# Patient Record
Sex: Female | Born: 1955 | Race: White | Hispanic: No | Smoking: Never smoker
Health system: Southern US, Community
[De-identification: ages and names within clinical notes are randomized; demographics above are authoritative.]

## PROBLEM LIST (undated history)

## (undated) DIAGNOSIS — K219 Gastro-esophageal reflux disease without esophagitis: Secondary | ICD-10-CM

## (undated) DIAGNOSIS — F419 Anxiety disorder, unspecified: Secondary | ICD-10-CM

## (undated) DIAGNOSIS — J45909 Unspecified asthma, uncomplicated: Secondary | ICD-10-CM

## (undated) DIAGNOSIS — H269 Unspecified cataract: Secondary | ICD-10-CM

## (undated) DIAGNOSIS — T7840XA Allergy, unspecified, initial encounter: Secondary | ICD-10-CM

## (undated) HISTORY — DX: Unspecified asthma, uncomplicated: J45.909

## (undated) HISTORY — PX: TUBAL LIGATION: SHX77

## (undated) HISTORY — PX: EYE SURGERY: SHX253

## (undated) HISTORY — DX: Unspecified cataract: H26.9

## (undated) HISTORY — DX: Allergy, unspecified, initial encounter: T78.40XA

## (undated) HISTORY — DX: Gastro-esophageal reflux disease without esophagitis: K21.9

## (undated) HISTORY — DX: Anxiety disorder, unspecified: F41.9

---

## 1994-03-26 HISTORY — PX: THYROID LOBECTOMY: SHX420

## 2021-07-12 NOTE — Progress Notes (Signed)
? ?BP 135/76   Pulse 86   Temp 98.4 ?F (36.9 ?C) (Oral)   Ht 5' 7.5" (1.715 m)   Wt 147 lb 6.4 oz (66.9 kg)   SpO2 98%   BMI 22.75 kg/m?   ? ?Subjective:  ? ? Patient ID: [facility], female    DOB: 10-Dec-1955, 66 y.o.   MRN: [id number] ? ?HPI: ?Krystal Elliott is a 66 y.o. female ? ?Chief Complaint  ?Patient presents with  ? Establish Care  ? ?Patient presents to clinic to establish care with new PCP.  Introduced to Publishing rights manager role and practice setting.  All questions answered.  Discussed provider/patient relationship and expectations.  Patient reports a history of Constipation, Seasonal Allergies, Anxiety and Depression.  Patient is new to the area she moved here from [place] in September 2022.  Has had one lobe of her thyroid removed due to a mass in 1996 and has never been on medication. Her seasonal allergies have caused her to be hospitalized with nebulizer.   ? ? ?Patient denies a history of: Hypertension, Elevated Cholesterol, Diabetes, Neurological problems, and Abdominal problems.  ? ? Denies HA, CP, SOB, dizziness, palpitations, visual changes, and lower extremity swelling. ? ? ?Active Ambulatory Problems  ?  Diagnosis Date Noted  ? Anxiety 07/13/2021  ? Depression, recurrent (HCC) 07/13/2021  ? Seasonal allergies 07/13/2021  ? 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 ?Resolved Ambulatory Problems  ?  Diagnosis Date Noted  ? No Resolved Ambulatory Problems  ? ?Past Medical History:  ?Diagnosis Date  ? Allergy   ? Asthma   ? ?Past Surgical History:  ?Procedure Laterality Date  ? TUBAL LIGATION    ? ?History reviewed. No pertinent family history. ? ? ?Review of Systems  ?Eyes:  Negative for visual disturbance.  ?Respiratory:  Negative for cough, chest tightness and shortness of breath.   ?Cardiovascular:  Negative for chest pain, palpitations and leg swelling.  ?Neurological:  Negative for dizziness and headaches.  ? ?Per HPI unless specifically indicated above ? ?   ?Objective:  ?   ?BP 135/76   Pulse 86   Temp 98.4 ?F (36.9 ?C) (Oral)   Ht 5' 7.5" (1.715 m)   Wt 147 lb 6.4 oz (66.9 kg)   SpO2 98%   BMI 22.75 kg/m?   ?Wt Readings from Last 3 Encounters:  ?07/13/21 147 lb 6.4 oz (66.9 kg)  ?  ?Physical Exam ?Vitals and nursing note reviewed.  ?Constitutional:   ?   General: She is not in acute distress. ?   Appearance: Normal appearance. She is normal weight. She is not ill-appearing, toxic-appearing or diaphoretic.  ?HENT:  ?   Head: Normocephalic.  ?   Right Ear: External ear normal.  ?   Left Ear: External ear normal.  ?   Nose: Nose normal.  ?   Mouth/Throat:  ?   Mouth: Mucous membranes are moist.  ?   Pharynx: Oropharynx is clear.  ?Eyes:  ?   General:     ?   Right eye: No discharge.     ?   Left eye: No discharge.  ?   Extraocular Movements: Extraocular movements intact.  ?   Conjunctiva/sclera: Conjunctivae normal.  ?   Pupils: Pupils are equal, round, and reactive to light.  ?Cardiovascular:  ?   Rate and Rhythm: Normal rate and regular rhythm.  ?   Heart sounds: No murmur heard. ?Pulmonary:  ?   Effort: Pulmonary effort is normal. No  respiratory distress.  ?   Breath sounds: Normal breath sounds. No wheezing or rales.  ?Musculoskeletal:  ?   Cervical back: Normal range of motion and neck supple.  ?Skin: ?   General: Skin is warm and dry.  ?   Capillary Refill: Capillary refill takes less than 2 seconds.  ?Neurological:  ?   General: No focal deficit present.  ?   Mental Status: She is alert and oriented to person, place, and time. Mental status is at baseline.  ?Psychiatric:     ?   Mood and Affect: Mood normal.     ?   Behavior: Behavior normal.     ?   Thought Content: Thought content normal.     ?   Judgment: Judgment normal.  ? ? ?No results found for this or any previous visit. ?   ?Assessment & Plan:  ? ?Problem List Items Addressed This Visit   ? ?  ? Other  ? Anxiety  ?  Chronic.  Controlled.  Continue with current medication regimen of Celexa 10mg  daily.  Refill sent  today.  Return to clinic in 1 months for reevaluation.  Call sooner if concerns arise.  ?  ?  ? Relevant Medications  ? citalopram (CELEXA) 10 MG tablet  ? Depression, recurrent (HCC)  ?  Chronic.  Controlled.  Continue with current medication regimen of Celexa 10mg  daily.  Refill sent today.  Return to clinic in 1 months for reevaluation.  Call sooner if concerns arise.  ? ? ?  ?  ? Relevant Medications  ? citalopram (CELEXA) 10 MG tablet  ? Seasonal allergies  ?  Chronic. Usually controlled with OTC and Singulair PRN.  Has been out of Singulair.  Will send refill at visit today.  Follow up in 1 month for reevaluation. ? ?  ?  ? History of lobectomy of thyroid  ?  Lobectomy done in 1996.  Has never been on medication. Mass was encapsulated and no further treatment required.  Will check labs at future visits and treat as needed.  Follow up in 1 month for reevaluation. ? ?  ?  ? ?Other Visit Diagnoses   ? ? Encounter to establish care    -  Primary  ? History of basal cell cancer      ? Relevant Orders  ? Ambulatory referral to Dermatology  ? ?  ?  ? ?Follow up plan: ?Return in about 1 month (around 08/12/2021) for Physical and Fasting labs. ? ? ? ? ? ?

## 2021-07-13 ENCOUNTER — Encounter: Payer: Self-pay | Admitting: Nurse Practitioner

## 2021-07-13 ENCOUNTER — Ambulatory Visit (INDEPENDENT_AMBULATORY_CARE_PROVIDER_SITE_OTHER): Payer: 59 | Admitting: Nurse Practitioner

## 2021-07-13 VITALS — BP 135/76 | HR 86 | Temp 98.4°F | Ht 67.5 in | Wt 147.4 lb

## 2021-07-13 DIAGNOSIS — Z85828 Personal history of other malignant neoplasm of skin: Secondary | ICD-10-CM

## 2021-07-13 DIAGNOSIS — J302 Other seasonal allergic rhinitis: Secondary | ICD-10-CM | POA: Diagnosis not present

## 2021-07-13 DIAGNOSIS — Z9009 Acquired absence of other part of head and neck: Secondary | ICD-10-CM

## 2021-07-13 DIAGNOSIS — Z7689 Persons encountering health services in other specified circumstances: Secondary | ICD-10-CM

## 2021-07-13 DIAGNOSIS — F339 Major depressive disorder, recurrent, unspecified: Secondary | ICD-10-CM | POA: Insufficient documentation

## 2021-07-13 DIAGNOSIS — R69 Illness, unspecified: Secondary | ICD-10-CM | POA: Diagnosis not present

## 2021-07-13 DIAGNOSIS — F419 Anxiety disorder, unspecified: Secondary | ICD-10-CM | POA: Diagnosis not present

## 2021-07-13 MED ORDER — ALBUTEROL SULFATE HFA 108 (90 BASE) MCG/ACT IN AERS: 1.0000 | INHALATION_SPRAY | Freq: Four times a day (QID) | RESPIRATORY_TRACT | 1 refills | Status: DC | PRN

## 2021-07-13 MED ORDER — MONTELUKAST SODIUM 10 MG PO TABS: 10.0000 mg | ORAL_TABLET | Freq: Every day | ORAL | 1 refills | Status: DC

## 2021-07-13 MED ORDER — CITALOPRAM HYDROBROMIDE 10 MG PO TABS: 10.0000 mg | ORAL_TABLET | Freq: Every day | ORAL | 1 refills | Status: DC

## 2021-07-13 NOTE — Assessment & Plan Note (Signed)
Chronic.  Controlled.  Continue with current medication regimen of Celexa 10mg daily.  Refill sent today.  Return to clinic in 1 months for reevaluation.  Call sooner if concerns arise.  ?

## 2021-07-13 NOTE — Assessment & Plan Note (Signed)
Lobectomy done in 1996.  Has never been on medication. Mass was encapsulated and no further treatment required.  Will check labs at future visits and treat as needed.  Follow up in 1 month for reevaluation. ?

## 2021-07-13 NOTE — Assessment & Plan Note (Signed)
Chronic. Usually controlled with OTC and Singulair PRN.  Has been out of Singulair.  Will send refill at visit today.  Follow up in 1 month for reevaluation. ?

## 2021-07-13 NOTE — Assessment & Plan Note (Signed)
Chronic.  Controlled.  Continue with current medication regimen of Celexa 10mg  daily.  Refill sent today.  Return to clinic in 1 months for reevaluation.  Call sooner if concerns arise.  ?

## 2021-08-04 ENCOUNTER — Other Ambulatory Visit: Payer: Self-pay | Admitting: Nurse Practitioner

## 2021-08-07 NOTE — Telephone Encounter (Signed)
Requested Prescriptions  ?Pending Prescriptions Disp Refills  ?? citalopram (CELEXA) 10 MG tablet [Pharmacy Med Name: CITALOPRAM HBR 10 MG TABLET] 90 tablet 2  ?  Sig: TAKE 1 TABLET BY MOUTH EVERY DAY  ?  ? Psychiatry:  Antidepressants - SSRI Passed - 08/04/2021  1:31 PM  ?  ?  Passed - Completed PHQ-2 or PHQ-9 in the last 360 days  ?  ?  Passed - Valid encounter within last 6 months  ?  Recent Outpatient Visits   ?      ? 3 weeks ago Encounter to establish care  ? [facility] Larae Grooms, NP  ?  ?  ?Future Appointments   ?        ? In 1 week Larae Grooms, NP [facility]  ?  ? ?  ?  ?  ? ? ?

## 2021-08-16 ENCOUNTER — Ambulatory Visit (INDEPENDENT_AMBULATORY_CARE_PROVIDER_SITE_OTHER): Payer: 59 | Admitting: Nurse Practitioner

## 2021-08-16 ENCOUNTER — Other Ambulatory Visit (HOSPITAL_COMMUNITY)
Admission: RE | Admit: 2021-08-16 | Discharge: 2021-08-16 | Disposition: A | Discharge status: Home / Self Care | Payer: 59 | Source: Ambulatory Visit | Attending: Nurse Practitioner | Admitting: Nurse Practitioner

## 2021-08-16 ENCOUNTER — Encounter: Payer: Self-pay | Admitting: Nurse Practitioner

## 2021-08-16 VITALS — BP 122/66 | HR 71 | Temp 98.2°F | Ht 66.14 in | Wt 145.8 lb

## 2021-08-16 DIAGNOSIS — F419 Anxiety disorder, unspecified: Secondary | ICD-10-CM

## 2021-08-16 DIAGNOSIS — Z0000 Encounter for general adult medical examination without abnormal findings: Secondary | ICD-10-CM

## 2021-08-16 DIAGNOSIS — Z23 Encounter for immunization: Secondary | ICD-10-CM

## 2021-08-16 DIAGNOSIS — Z136 Encounter for screening for cardiovascular disorders: Secondary | ICD-10-CM | POA: Diagnosis not present

## 2021-08-16 DIAGNOSIS — R69 Illness, unspecified: Secondary | ICD-10-CM | POA: Diagnosis not present

## 2021-08-16 DIAGNOSIS — Z9009 Acquired absence of other part of head and neck: Secondary | ICD-10-CM

## 2021-08-16 DIAGNOSIS — Z1231 Encounter for screening mammogram for malignant neoplasm of breast: Secondary | ICD-10-CM | POA: Diagnosis not present

## 2021-08-16 DIAGNOSIS — F339 Major depressive disorder, recurrent, unspecified: Secondary | ICD-10-CM | POA: Diagnosis not present

## 2021-08-16 MED ORDER — CITALOPRAM HYDROBROMIDE 10 MG PO TABS: 10.0000 mg | ORAL_TABLET | Freq: Every day | ORAL | 1 refills | Status: DC

## 2021-08-16 NOTE — Assessment & Plan Note (Signed)
Ongoing. Patient has been getting thyroid scans annually.  Has not had one in 2-3 years. Ordered today for evaluation.

## 2021-08-16 NOTE — Patient Instructions (Signed)
Please call to schedule your mammogram and/or bone density: ?[facility]  ?Address: [address], [place], [place] [address] ?Phone: [phone]  ?

## 2021-08-16 NOTE — Progress Notes (Signed)
BP 122/66   Pulse 71   Temp 98.2 F (36.8 C) (Oral)   Ht 5' 6.14" (1.68 m)   Wt 145 lb 12.8 oz (66.1 kg)   SpO2 97%   BMI 23.43 kg/m    Subjective:    [facility]    DOB: [birth year], 66 y.o.   MRN: [id number]  HPI: Krystal RoyalsJanet Elliott is a 66 y.o. female presenting on 08/16/2021 for comprehensive medical examination. Current medical complaints include:none  She currently lives with: Menopausal Symptoms: no  MOOD Patient states her celexa works great for her.  Keeps her very level.  Denies concerns at visit today. Denies SI.  Depression Screen done today and results listed below:     08/16/2021    1:34 PM 07/13/2021    8:50 AM 07/13/2021    8:49 AM  Depression screen PHQ 2/9  Decreased Interest 0 0 0  Down, Depressed, Hopeless 0 0 0  PHQ - 2 Score 0 0 0  Altered sleeping 0 0 0  Tired, decreased energy 0 1 0  Change in appetite 0 0 0  Feeling bad or failure about yourself  0 0 1  Trouble concentrating 0 0 0  Moving slowly or fidgety/restless 0 0 0  Suicidal thoughts 0 0 0  PHQ-9 Score 0 1 1  Difficult doing work/chores Not difficult at all Somewhat difficult Not difficult at all    The patient does not have a history of falls. I did complete a risk assessment for falls. A plan of care for falls was documented.   Past Medical History:  Past Medical History:  Diagnosis Date   Allergy    Asthma     Surgical History:  Past Surgical History:  Procedure Laterality Date   TUBAL LIGATION      Medications:  Current Outpatient Medications on File Prior to Visit  Medication Sig   albuterol (VENTOLIN HFA) 108 (90 Base) MCG/ACT inhaler Inhale 1-2 puffs into the lungs every 6 (six) hours as needed for wheezing or shortness of breath.   Cholecalciferol (VITAMIN D3) 10 MCG (400 UNIT) tablet Take by mouth.   montelukast (SINGULAIR) 10 MG tablet Take 1 tablet (10 mg total) by mouth at bedtime.   No current facility-administered medications on file prior to  visit.    Allergies:  Allergies  Allergen Reactions   Penicillins Rash   Sulfa Antibiotics Rash    Social History:  Social History   Socioeconomic History   Marital status: Unknown    Spouse name: Not on file   Number of children: Not on file   Years of education: Not on file   Highest education level: Not on file  Occupational History   Not on file  Tobacco Use   Smoking status: Never   Smokeless tobacco: Never  Vaping Use   Vaping Use: Never used  Substance and Sexual Activity   Alcohol use: Yes   Drug use: Not Currently   Sexual activity: Yes  Other Topics Concern   Not on file  Social History Narrative   Not on file   Social Determinants of Health   Financial Resource Strain: Not on file  Food Insecurity: Not on file  Transportation Needs: Not on file  Physical Activity: Not on file  Stress: Not on file  Social Connections: Not on file  Intimate Partner Violence: Not on file   Social History   Tobacco Use  Smoking Status Never  Smokeless Tobacco Never   Social  History   Substance and Sexual Activity  Alcohol Use Yes    Family History:  History reviewed. No pertinent family history.  Past medical history, surgical history, medications, allergies, family history and social history reviewed with patient today and changes made to appropriate areas of the chart.   Review of Systems  Psychiatric/Behavioral:  Negative for depression and suicidal ideas. The patient is not nervous/anxious.   All other ROS negative except what is listed above and in the HPI.      Objective:    BP 122/66   Pulse 71   Temp 98.2 F (36.8 C) (Oral)   Ht 5' 6.14" (1.68 m)   Wt 145 lb 12.8 oz (66.1 kg)   SpO2 97%   BMI 23.43 kg/m   Wt Readings from Last 3 Encounters:  08/16/21 145 lb 12.8 oz (66.1 kg)  07/13/21 147 lb 6.4 oz (66.9 kg)    Physical Exam Vitals and nursing note reviewed. Exam conducted with a chaperone present [facility] [place] [place]).  Constitutional:       General: She is awake. She is not in acute distress.    Appearance: Normal appearance. She is well-developed and normal weight. She is not ill-appearing.  HENT:     Head: Normocephalic and atraumatic.     Right Ear: Hearing, tympanic membrane, ear canal and external ear normal. No drainage.     Left Ear: Hearing, tympanic membrane, ear canal and external ear normal. No drainage.     Nose: Nose normal.     Right Sinus: No maxillary sinus tenderness or frontal sinus tenderness.     Left Sinus: No maxillary sinus tenderness or frontal sinus tenderness.     Mouth/Throat:     Mouth: Mucous membranes are moist.     Pharynx: Oropharynx is clear. Uvula midline. No pharyngeal swelling, oropharyngeal exudate or posterior oropharyngeal erythema.  Eyes:     General: Lids are normal.        Right eye: No discharge.        Left eye: No discharge.     Extraocular Movements: Extraocular movements intact.     Conjunctiva/sclera: Conjunctivae normal.     Pupils: Pupils are equal, round, and reactive to light.     Visual Fields: Right eye visual fields normal and left eye visual fields normal.  Neck:     Thyroid: No thyromegaly.     Vascular: No carotid bruit.     Trachea: Trachea normal.  Cardiovascular:     Rate and Rhythm: Normal rate and regular rhythm.     Heart sounds: Normal heart sounds. No murmur heard.   No gallop.  Pulmonary:     Effort: Pulmonary effort is normal. No accessory muscle usage or respiratory distress.     Breath sounds: Normal breath sounds.  Chest:  Breasts:    Right: Normal.     Left: Normal.  Abdominal:     General: Bowel sounds are normal.     Palpations: Abdomen is soft. There is no hepatomegaly or splenomegaly.     Tenderness: There is no abdominal tenderness.  Genitourinary:    Exam position: Knee-chest position.     Vagina: Normal.     Cervix: Normal.     Adnexa: Right adnexa normal and left adnexa normal.  Musculoskeletal:        General: Normal range  of motion.     Cervical back: Normal range of motion and neck supple.     Right lower leg: No edema.  Left lower leg: No edema.  Lymphadenopathy:     Head:     Right side of head: No submental, submandibular, tonsillar, preauricular or posterior auricular adenopathy.     Left side of head: No submental, submandibular, tonsillar, preauricular or posterior auricular adenopathy.     Cervical: No cervical adenopathy.     Upper Body:     Right upper body: No supraclavicular, axillary or pectoral adenopathy.     Left upper body: No supraclavicular, axillary or pectoral adenopathy.  Skin:    General: Skin is warm and dry.     Capillary Refill: Capillary refill takes less than 2 seconds.     Findings: No rash.  Neurological:     Mental Status: She is alert and oriented to person, place, and time.     Gait: Gait is intact.     Deep Tendon Reflexes: Reflexes are normal and symmetric.     Reflex Scores:      Brachioradialis reflexes are 2+ on the right side and 2+ on the left side.      Patellar reflexes are 2+ on the right side and 2+ on the left side. Psychiatric:        Attention and Perception: Attention normal.        Mood and Affect: Mood normal.        Speech: Speech normal.        Behavior: Behavior normal. Behavior is cooperative.        Thought Content: Thought content normal.        Judgment: Judgment normal.    No results found for this or any previous visit.    Assessment & Plan:   Problem List Items Addressed This Visit       Other   Anxiety    Chronic.  Controlled.  Continue with current medication regimen of Celexa 10mg  daily.  Refill sent today.  Labs ordered today.  Return to clinic in 6 months for reevaluation.  Call sooner if concerns arise.        Relevant Medications   citalopram (CELEXA) 10 MG tablet   Depression, recurrent (HCC)    Chronic.  Controlled.  Continue with current medication regimen of Celexa 10mg  daily.  Refill sent today.  Labs ordered  today.  Return to clinic in 6 months for reevaluation.  Call sooner if concerns arise.         Relevant Medications   citalopram (CELEXA) 10 MG tablet   History of lobectomy of thyroid    Ongoing. Patient has been getting thyroid scans annually.  Has not had one in 2-3 years. Ordered today for evaluation.       Relevant Orders   THYROID   Other Visit Diagnoses     Annual physical exam    -  Primary   Health maintenance reviewed during visit today. Labs ordered. TDAP given. Mammogram ordered. Colonoscopy up to date.    Relevant Orders   CBC with Differential/Platelet   Comprehensive metabolic panel   Lipid panel   TSH   Urinalysis, Routine w reflex microscopic   Cytology - PAP   HIV Antibody (routine testing w rflx)   Hepatitis C Antibody   Screening for ischemic heart disease       Relevant Orders   Lipid panel   Encounter for screening mammogram for malignant neoplasm of breast       Relevant Orders   MM 3D SCREEN BREAST BILATERAL        Follow  up plan: Return in about 6 months (around 02/16/2022) for Depression/Anxiety FU.   LABORATORY TESTING:  - Pap smear: pap done  IMMUNIZATIONS:   - Tdap: Tetanus vaccination status reviewed: last tetanus booster within 10 years. - Influenza: Postponed to flu season - Pneumovax: Not applicable - Prevnar: Not applicable - COVID: Up to date - HPV: Not applicable - Shingrix vaccine:  Discussed at visit today  SCREENING: -Mammogram: Ordered today  - Colonoscopy: Up to date  - Bone Density: Not applicable  -Hearing Test: Not applicable  -Spirometry: Not applicable   PATIENT COUNSELING:   Advised to take 1 mg of folate supplement per day if capable of pregnancy.   Sexuality: Discussed sexually transmitted diseases, partner selection, use of condoms, avoidance of unintended pregnancy  and contraceptive alternatives.   Advised to avoid cigarette smoking.  I discussed with the patient that most people either abstain  from alcohol or drink within safe limits (<=14/week and <=4 drinks/occasion for males, <=7/weeks and <= 3 drinks/occasion for females) and that the risk for alcohol disorders and other health effects rises proportionally with the number of drinks per week and how often a drinker exceeds daily limits.  Discussed cessation/primary prevention of drug use and availability of treatment for abuse.   Diet: Encouraged to adjust caloric intake to maintain  or achieve ideal body weight, to reduce intake of dietary saturated fat and total fat, to limit sodium intake by avoiding high sodium foods and not adding table salt, and to maintain adequate dietary potassium and calcium preferably from fresh fruits, vegetables, and low-fat dairy products.    stressed the importance of regular exercise  Injury prevention: Discussed safety belts, safety helmets, smoke detector, smoking near bedding or upholstery.   Dental health: Discussed importance of regular tooth brushing, flossing, and dental visits.    NEXT PREVENTATIVE PHYSICAL DUE IN 1 YEAR. Return in about 6 months (around 02/16/2022) for Depression/Anxiety FU.

## 2021-08-16 NOTE — Assessment & Plan Note (Signed)
Chronic.  Controlled.  Continue with current medication regimen of Celexa 10mg daily.  Refill sent today.  Labs ordered today.  Return to clinic in 6 months for reevaluation.  Call sooner if concerns arise.  

## 2021-08-16 NOTE — Assessment & Plan Note (Signed)
Chronic.  Controlled.  Continue with current medication regimen of Celexa 10mg  daily.  Refill sent today.  Labs ordered today.  Return to clinic in 6 months for reevaluation.  Call sooner if concerns arise.

## 2021-08-17 DIAGNOSIS — D485 Neoplasm of uncertain behavior of skin: Secondary | ICD-10-CM | POA: Diagnosis not present

## 2021-08-17 DIAGNOSIS — D2271 Melanocytic nevi of right lower limb, including hip: Secondary | ICD-10-CM | POA: Diagnosis not present

## 2021-08-17 DIAGNOSIS — D2261 Melanocytic nevi of right upper limb, including shoulder: Secondary | ICD-10-CM | POA: Diagnosis not present

## 2021-08-17 DIAGNOSIS — L570 Actinic keratosis: Secondary | ICD-10-CM | POA: Diagnosis not present

## 2021-08-17 DIAGNOSIS — D2262 Melanocytic nevi of left upper limb, including shoulder: Secondary | ICD-10-CM | POA: Diagnosis not present

## 2021-08-17 DIAGNOSIS — X32XXXA Exposure to sunlight, initial encounter: Secondary | ICD-10-CM | POA: Diagnosis not present

## 2021-08-17 DIAGNOSIS — L814 Other melanin hyperpigmentation: Secondary | ICD-10-CM | POA: Diagnosis not present

## 2021-08-17 DIAGNOSIS — D225 Melanocytic nevi of trunk: Secondary | ICD-10-CM | POA: Diagnosis not present

## 2021-08-17 DIAGNOSIS — D2272 Melanocytic nevi of left lower limb, including hip: Secondary | ICD-10-CM | POA: Diagnosis not present

## 2021-08-17 LAB — COMPREHENSIVE METABOLIC PANEL
ALT: 15 IU/L (ref 0–32)
AST: 16 IU/L (ref 0–40)
Albumin/Globulin Ratio: 2.2 (ref 1.2–2.2)
Albumin: 4.8 g/dL (ref 3.8–4.8)
Alkaline Phosphatase: 62 IU/L (ref 44–121)
BUN/Creatinine Ratio: 13 (ref 12–28)
BUN: 11 mg/dL (ref 8–27)
Bilirubin Total: 0.7 mg/dL (ref 0.0–1.2)
CO2: 24 mmol/L (ref 20–29)
Calcium: 9.4 mg/dL (ref 8.7–10.3)
Chloride: 96 mmol/L (ref 96–106)
Creatinine, Ser: 0.86 mg/dL (ref 0.57–1.00)
Globulin, Total: 2.2 g/dL (ref 1.5–4.5)
Glucose: 84 mg/dL (ref 70–99)
Potassium: 3.8 mmol/L (ref 3.5–5.2)
Sodium: 135 mmol/L (ref 134–144)
Total Protein: 7 g/dL (ref 6.0–8.5)
eGFR: 75 mL/min/{1.73_m2} (ref >59)

## 2021-08-17 LAB — TSH: TSH: 1.29 u[IU]/mL (ref 0.450–4.500)

## 2021-08-17 LAB — URINALYSIS, ROUTINE W REFLEX MICROSCOPIC
Bilirubin, UA: NEGATIVE
Glucose, UA: NEGATIVE
Ketones, UA: NEGATIVE
Leukocytes,UA: NEGATIVE
Nitrite, UA: NEGATIVE
Protein,UA: NEGATIVE
RBC, UA: NEGATIVE
Specific Gravity, UA: 1.01 (ref 1.005–1.030)
Urobilinogen, Ur: 0.2 mg/dL (ref 0.2–1.0)
pH, UA: 6 (ref 5.0–7.5)

## 2021-08-17 LAB — CBC WITH DIFFERENTIAL/PLATELET
Basophils Absolute: 0.1 10*3/uL (ref 0.0–0.2)
Basos: 1 %
EOS (ABSOLUTE): 0.2 10*3/uL (ref 0.0–0.4)
Eos: 2 %
Hematocrit: 37 % (ref 34.0–46.6)
Hemoglobin: 12.8 g/dL (ref 11.1–15.9)
Immature Grans (Abs): 0 10*3/uL (ref 0.0–0.1)
Immature Granulocytes: 0 %
Lymphocytes Absolute: 2.7 10*3/uL (ref 0.7–3.1)
Lymphs: 23 %
MCH: 31.8 pg (ref 26.6–33.0)
MCHC: 34.6 g/dL (ref 31.5–35.7)
MCV: 92 fL (ref 79–97)
Monocytes Absolute: 1 10*3/uL — ABNORMAL HIGH (ref 0.1–0.9)
Monocytes: 9 %
Neutrophils Absolute: 7.7 10*3/uL — ABNORMAL HIGH (ref 1.4–7.0)
Neutrophils: 65 %
Platelets: 274 10*3/uL (ref 150–450)
RBC: 4.02 x10E6/uL (ref 3.77–5.28)
RDW: 11.8 % (ref 11.7–15.4)
WBC: 11.8 10*3/uL — ABNORMAL HIGH (ref 3.4–10.8)

## 2021-08-17 LAB — LIPID PANEL
Chol/HDL Ratio: 2.7 ratio (ref 0.0–4.4)
Cholesterol, Total: 232 mg/dL — ABNORMAL HIGH (ref 100–199)
HDL: 86 mg/dL (ref >39)
LDL Chol Calc (NIH): 129 mg/dL — ABNORMAL HIGH (ref 0–99)
Triglycerides: 101 mg/dL (ref 0–149)
VLDL Cholesterol Cal: 17 mg/dL (ref 5–40)

## 2021-08-17 LAB — HIV ANTIBODY (ROUTINE TESTING W REFLEX): HIV Screen 4th Generation wRfx: NONREACTIVE

## 2021-08-17 LAB — HEPATITIS C ANTIBODY: Hep C Virus Ab: NONREACTIVE

## 2021-08-17 NOTE — Progress Notes (Signed)
Hi Krystal Elliott.  It was nice to see you yesterday.  Your lab work looks good. Your cholesterol is elevated. I recommend following a low fat diet and exercise. No other concerns at this time.  Your thyroid labs are normal.  Follow up as discussed.

## 2021-08-22 LAB — CYTOLOGY - PAP: Diagnosis: NEGATIVE

## 2021-08-23 NOTE — Progress Notes (Signed)
HI Krystal Elliott.  Your PAP was normal.  We will repeat one more at 67 and that should be it unless it is abnormal.  Please let me know if you have any questions.

## 2021-08-28 ENCOUNTER — Ambulatory Visit
Admission: RE | Admit: 2021-08-28 | Discharge: 2021-08-28 | Disposition: A | Discharge status: Home / Self Care | Payer: 59 | Source: Ambulatory Visit | Attending: Nurse Practitioner | Admitting: Nurse Practitioner

## 2021-08-28 DIAGNOSIS — E890 Postprocedural hypothyroidism: Secondary | ICD-10-CM | POA: Diagnosis not present

## 2021-08-28 DIAGNOSIS — Z9009 Acquired absence of other part of head and neck: Secondary | ICD-10-CM | POA: Diagnosis not present

## 2021-08-28 NOTE — Progress Notes (Signed)
Hi Krystal Elliott.  Your ultrasound showed you had a left thyroidectomy.  No other abnormalities found.

## 2021-10-12 DIAGNOSIS — L905 Scar conditions and fibrosis of skin: Secondary | ICD-10-CM | POA: Diagnosis not present

## 2021-10-12 DIAGNOSIS — L570 Actinic keratosis: Secondary | ICD-10-CM | POA: Diagnosis not present

## 2021-10-12 DIAGNOSIS — D225 Melanocytic nevi of trunk: Secondary | ICD-10-CM | POA: Diagnosis not present

## 2021-11-06 ENCOUNTER — Other Ambulatory Visit: Payer: Self-pay | Admitting: Nurse Practitioner

## 2021-11-06 NOTE — Telephone Encounter (Signed)
last RF 08/16/21 #90 1 F   too soon  Requested Prescriptions  Refused Prescriptions Disp Refills  . citalopram (CELEXA) 10 MG tablet [Pharmacy Med Name: CITALOPRAM HBR 10 MG TABLET] 30 tablet 2    Sig: TAKE 1 TABLET BY MOUTH EVERY DAY     Psychiatry:  Antidepressants - SSRI Passed - 11/06/2021  2:17 AM      Passed - Completed PHQ-2 or PHQ-9 in the last 360 days      Passed - Valid encounter within last 6 months    Recent Outpatient Visits          2 months ago Annual physical exam   [facility] Larae Grooms, NP   3 months ago Encounter to establish care   [facility] Larae Grooms, NP      Future Appointments            In 3 months Larae Grooms, NP [facility]

## 2022-01-05 ENCOUNTER — Other Ambulatory Visit: Payer: Self-pay | Admitting: Nurse Practitioner

## 2022-01-05 NOTE — Telephone Encounter (Signed)
Requested Prescriptions  Pending Prescriptions Disp Refills  . montelukast (SINGULAIR) 10 MG tablet [Pharmacy Med Name: MONTELUKAST SOD 10 MG TABLET] 90 tablet 0    Sig: TAKE 1 TABLET BY MOUTH EVERYDAY AT BEDTIME     Pulmonology:  Leukotriene Inhibitors Passed - 01/05/2022  1:31 AM      Passed - Valid encounter within last 12 months    Recent Outpatient Visits          4 months ago Annual physical exam   [facility] Jon Billings, NP   5 months ago Encounter to establish care   [facility] Jon Billings, NP      Future Appointments            In 1 month Jon Billings, NP [facility] [place]

## 2022-02-19 ENCOUNTER — Ambulatory Visit: Payer: 59 | Admitting: Nurse Practitioner

## 2022-03-12 ENCOUNTER — Encounter: Payer: Self-pay | Admitting: Nurse Practitioner

## 2022-03-12 ENCOUNTER — Ambulatory Visit: Payer: 59 | Admitting: Nurse Practitioner

## 2022-03-12 VITALS — BP 112/71 | HR 73 | Temp 97.8°F | Wt 148.2 lb

## 2022-03-12 DIAGNOSIS — F419 Anxiety disorder, unspecified: Secondary | ICD-10-CM | POA: Diagnosis not present

## 2022-03-12 DIAGNOSIS — R69 Illness, unspecified: Secondary | ICD-10-CM | POA: Diagnosis not present

## 2022-03-12 DIAGNOSIS — Z1211 Encounter for screening for malignant neoplasm of colon: Secondary | ICD-10-CM | POA: Diagnosis not present

## 2022-03-12 DIAGNOSIS — E7800 Pure hypercholesterolemia, unspecified: Secondary | ICD-10-CM | POA: Diagnosis not present

## 2022-03-12 DIAGNOSIS — F339 Major depressive disorder, recurrent, unspecified: Secondary | ICD-10-CM

## 2022-03-12 MED ORDER — ALBUTEROL SULFATE HFA 108 (90 BASE) MCG/ACT IN AERS
1.0000 | INHALATION_SPRAY | Freq: Four times a day (QID) | RESPIRATORY_TRACT | 1 refills | Status: DC | PRN
Start: 2022-03-12 — End: 2022-08-22

## 2022-03-12 MED ORDER — CITALOPRAM HYDROBROMIDE 10 MG PO TABS: 10.0000 mg | ORAL_TABLET | Freq: Every day | ORAL | 1 refills | Status: DC

## 2022-03-12 NOTE — Assessment & Plan Note (Signed)
Chronic.  Controlled.  Continue with current medication regimen of Celexa 10mg.  Refills sent today.  Labs ordered today.  Return to clinic in 6 months for reevaluation.  Call sooner if concerns arise.   

## 2022-03-12 NOTE — Progress Notes (Signed)
BP 112/71   Pulse 73   Temp 97.8 F (36.6 C) (Oral)   Wt 148 lb 3.2 oz (67.2 kg)   SpO2 96%   BMI 23.82 kg/m    Subjective:    Patient ID: [facility], female    DOB: 06-18-1955, 66 y.o.   MRN: [id number]  HPI: Krystal Elliott is a 66 y.o. female  Chief Complaint  Patient presents with   Depression   Anxiety   MOOD Patient states she has been doing well.  Feels like the Celexa is working well for her.  Denies concerns regarding her medication.  Denies SI.    [facility] Visit from 03/12/2022 in [place]  PHQ-9 Total Score 1         03/12/2022    2:38 PM 08/16/2021    1:34 PM 07/13/2021    8:51 AM  GAD 7 : Generalized Anxiety Score  Nervous, Anxious, on Edge 0 0 2  Control/stop worrying 0 0 2  Worry too much - different things 0 0 2  Trouble relaxing 0 0 1  Restless 0 0 0  Easily annoyed or irritable 0 0 2  Afraid - awful might happen 0 0 1  Total GAD 7 Score 0 0 10  Anxiety Difficulty Not difficult at all Not difficult at all Somewhat difficult      Relevant past medical, surgical, family and social history reviewed and updated as indicated. Interim medical history since our last visit reviewed. Allergies and medications reviewed and updated.  Review of Systems  Psychiatric/Behavioral:  Positive for dysphoric mood. Negative for suicidal ideas. The patient is nervous/anxious.     Per HPI unless specifically indicated above     Objective:    BP 112/71   Pulse 73   Temp 97.8 F (36.6 C) (Oral)   Wt 148 lb 3.2 oz (67.2 kg)   SpO2 96%   BMI 23.82 kg/m   Wt Readings from Last 3 Encounters:  03/12/22 148 lb 3.2 oz (67.2 kg)  08/16/21 145 lb 12.8 oz (66.1 kg)  07/13/21 147 lb 6.4 oz (66.9 kg)    Physical Exam Vitals and nursing note reviewed.  Constitutional:      General: She is not in acute distress.    Appearance: Normal appearance. She is normal weight. She is not ill-appearing, toxic-appearing or diaphoretic.  HENT:      Head: Normocephalic.     Right Ear: External ear normal.     Left Ear: External ear normal.     Nose: Nose normal.     Mouth/Throat:     Mouth: Mucous membranes are moist.     Pharynx: Oropharynx is clear.  Eyes:     General:        Right eye: No discharge.        Left eye: No discharge.     Extraocular Movements: Extraocular movements intact.     Conjunctiva/sclera: Conjunctivae normal.     Pupils: Pupils are equal, round, and reactive to light.  Cardiovascular:     Rate and Rhythm: Normal rate and regular rhythm.     Heart sounds: No murmur heard. Pulmonary:     Effort: Pulmonary effort is normal. No respiratory distress.     Breath sounds: Normal breath sounds. No wheezing or rales.  Musculoskeletal:     Cervical back: Normal range of motion and neck supple.  Skin:    General: Skin is warm and dry.     Capillary Refill:  Capillary refill takes less than 2 seconds.  Neurological:     General: No focal deficit present.     Mental Status: She is alert and oriented to person, place, and time. Mental status is at baseline.  Psychiatric:        Mood and Affect: Mood normal.        Behavior: Behavior normal.        Thought Content: Thought content normal.        Judgment: Judgment normal.     Results for orders placed or performed in visit on 08/16/21  CBC with Differential/Platelet  Result Value Ref Range   WBC 11.8 (H) 3.4 - 10.8 x10E3/uL   RBC 4.02 3.77 - 5.28 x10E6/uL   Hemoglobin 12.8 11.1 - 15.9 g/dL   Hematocrit 37.0 34.0 - 46.6 %   MCV 92 79 - 97 fL   MCH 31.8 26.6 - 33.0 pg   MCHC 34.6 31.5 - 35.7 g/dL   RDW 11.8 11.7 - 15.4 %   Platelets 274 150 - 450 x10E3/uL   Neutrophils 65 Not Estab. %   Lymphs 23 Not Estab. %   Monocytes 9 Not Estab. %   Eos 2 Not Estab. %   Basos 1 Not Estab. %   Neutrophils Absolute 7.7 (H) 1.4 - 7.0 x10E3/uL   Lymphocytes Absolute 2.7 0.7 - 3.1 x10E3/uL   Monocytes Absolute 1.0 (H) 0.1 - 0.9 x10E3/uL   EOS (ABSOLUTE) 0.2 0.0 - 0.4  x10E3/uL   Basophils Absolute 0.1 0.0 - 0.2 x10E3/uL   Immature Granulocytes 0 Not Estab. %   Immature Grans (Abs) 0.0 0.0 - 0.1 x10E3/uL  Comprehensive metabolic panel  Result Value Ref Range   Glucose 84 70 - 99 mg/dL   BUN 11 8 - 27 mg/dL   Creatinine, Ser 0.86 0.57 - 1.00 mg/dL   eGFR 75 >59 mL/min/1.73   BUN/Creatinine Ratio 13 12 - 28   Sodium 135 134 - 144 mmol/L   Potassium 3.8 3.5 - 5.2 mmol/L   Chloride 96 96 - 106 mmol/L   CO2 24 20 - 29 mmol/L   Calcium 9.4 8.7 - 10.3 mg/dL   Total Protein 7.0 6.0 - 8.5 g/dL   Albumin 4.8 3.8 - 4.8 g/dL   Globulin, Total 2.2 1.5 - 4.5 g/dL   Albumin/Globulin Ratio 2.2 1.2 - 2.2   Bilirubin Total 0.7 0.0 - 1.2 mg/dL   Alkaline Phosphatase 62 44 - 121 IU/L   AST 16 0 - 40 IU/L   ALT 15 0 - 32 IU/L  Lipid panel  Result Value Ref Range   Cholesterol, Total 232 (H) 100 - 199 mg/dL   Triglycerides 101 0 - 149 mg/dL   HDL 86 >39 mg/dL   VLDL Cholesterol Cal 17 5 - 40 mg/dL   LDL Chol Calc (NIH) 129 (H) 0 - 99 mg/dL   Chol/HDL Ratio 2.7 0.0 - 4.4 ratio  TSH  Result Value Ref Range   TSH 1.290 0.450 - 4.500 uIU/mL  Urinalysis, Routine w reflex microscopic  Result Value Ref Range   Specific Gravity, UA 1.010 1.005 - 1.030   pH, UA 6.0 5.0 - 7.5   Color, UA Yellow Yellow   Appearance Ur Clear Clear   Leukocytes,UA Negative Negative   Protein,UA Negative Negative/Trace   Glucose, UA Negative Negative   Ketones, UA Negative Negative   RBC, UA Negative Negative   Bilirubin, UA Negative Negative   Urobilinogen, Ur 0.2 0.2 - 1.0 mg/dL  Nitrite, UA Negative Negative  HIV Antibody (routine testing w rflx)  Result Value Ref Range   HIV Screen 4th Generation wRfx Non Reactive Non Reactive  Hepatitis C Antibody  Result Value Ref Range   Hep C Virus Ab Non Reactive Non Reactive  Cytology - PAP  Result Value Ref Range   Adequacy      Satisfactory for evaluation; transformation zone component PRESENT.   Diagnosis      - Negative for  intraepithelial lesion or malignancy (NILM)      Assessment & Plan:   Problem List Items Addressed This Visit       Other   Anxiety - Primary    Chronic.  Controlled.  Continue with current medication regimen of Celexa 10m.  Refills sent today.  Labs ordered today.  Return to clinic in 6 months for reevaluation.  Call sooner if concerns arise.       Relevant Medications   citalopram (CELEXA) 10 MG tablet   Other Relevant Orders   Comp Met (CMET)   Depression, recurrent (HCC)    Chronic.  Controlled.  Continue with current medication regimen of Celexa 130m  Refills sent today.  Labs ordered today.  Return to clinic in 6 months for reevaluation.  Call sooner if concerns arise.        Relevant Medications   citalopram (CELEXA) 10 MG tablet   Other Relevant Orders   Comp Met (CMET)   Hypercholesteremia    Chronic.  Controlled.  Continue with current medication regimen.  Labs ordered today.  Return to clinic in 6 months for reevaluation.  Call sooner if concerns arise.        Relevant Orders   Lipid Profile   Other Visit Diagnoses     Screening for colon cancer       Relevant Orders   Ambulatory referral to Gastroenterology        Follow up plan: Return in about 6 months (around 09/11/2022) for Physical and Fasting labs.

## 2022-03-12 NOTE — Assessment & Plan Note (Signed)
Chronic.  Controlled.  Continue with current medication regimen.  Labs ordered today.  Return to clinic in 6 months for reevaluation.  Call sooner if concerns arise.  ? ?

## 2022-03-13 LAB — LIPID PANEL
Chol/HDL Ratio: 2.7 ratio (ref 0.0–4.4)
Cholesterol, Total: 224 mg/dL — ABNORMAL HIGH (ref 100–199)
HDL: 83 mg/dL (ref >39)
LDL Chol Calc (NIH): 124 mg/dL — ABNORMAL HIGH (ref 0–99)
Triglycerides: 99 mg/dL (ref 0–149)
VLDL Cholesterol Cal: 17 mg/dL (ref 5–40)

## 2022-03-13 LAB — COMPREHENSIVE METABOLIC PANEL
ALT: 17 IU/L (ref 0–32)
AST: 14 IU/L (ref 0–40)
Albumin/Globulin Ratio: 1.9 (ref 1.2–2.2)
Albumin: 4.5 g/dL (ref 3.9–4.9)
Alkaline Phosphatase: 82 IU/L (ref 44–121)
BUN/Creatinine Ratio: 13 (ref 12–28)
BUN: 11 mg/dL (ref 8–27)
Bilirubin Total: 0.6 mg/dL (ref 0.0–1.2)
CO2: 24 mmol/L (ref 20–29)
Calcium: 9.3 mg/dL (ref 8.7–10.3)
Chloride: 99 mmol/L (ref 96–106)
Creatinine, Ser: 0.88 mg/dL (ref 0.57–1.00)
Globulin, Total: 2.4 g/dL (ref 1.5–4.5)
Glucose: 90 mg/dL (ref 70–99)
Potassium: 4.2 mmol/L (ref 3.5–5.2)
Sodium: 137 mmol/L (ref 134–144)
Total Protein: 6.9 g/dL (ref 6.0–8.5)
eGFR: 73 mL/min/{1.73_m2} (ref >59)

## 2022-03-13 NOTE — Progress Notes (Signed)
HI Krystal Elliott. It was nice to see you yesterday.  Your lab work looks good.  No concerns at this time. Continue with your current medication regimen.  Follow up as discussed.  Please let me know if you have any questions.

## 2022-03-14 ENCOUNTER — Telehealth: Payer: Self-pay

## 2022-03-14 NOTE — Telephone Encounter (Signed)
Called and scheduled patient's mammogram for 05/01/22 at 2:20 pm at [facility].   Called and LVM asking for patient to please return my call for appointment date and time.   OK for [facility] to speak to patient and give her the appointment date and time above for her mammogram.

## 2022-03-14 NOTE — Telephone Encounter (Signed)
-----   Message from Pablo Ledger, [place] sent at 03/12/2022  2:35 PM EST ----- Schedule mamma- no day or time pref

## 2022-04-08 ENCOUNTER — Other Ambulatory Visit: Payer: Self-pay | Admitting: Nurse Practitioner

## 2022-04-09 NOTE — Telephone Encounter (Signed)
Requested Prescriptions  Pending Prescriptions Disp Refills   montelukast (SINGULAIR) 10 MG tablet [Pharmacy Med Name: MONTELUKAST SOD 10 MG TABLET] 90 tablet 1    Sig: TAKE 1 TABLET BY MOUTH EVERYDAY AT BEDTIME     Pulmonology:  Leukotriene Inhibitors Passed - 04/08/2022  8:32 AM      Passed - Valid encounter within last 12 months    Recent Outpatient Visits           4 weeks ago [place], NP   7 months ago Annual physical exam   [facility] Jon Billings, NP   9 months ago Encounter to establish care   [place], NP       Future Appointments             In 5 months Jon Billings, NP [facility], [place]

## 2022-04-24 ENCOUNTER — Encounter: Payer: Self-pay | Admitting: Nurse Practitioner

## 2022-04-24 ENCOUNTER — Ambulatory Visit: Payer: 59 | Admitting: Nurse Practitioner

## 2022-04-24 VITALS — BP 119/71 | HR 74 | Temp 97.5°F | Ht 66.14 in | Wt 147.2 lb

## 2022-04-24 DIAGNOSIS — H029 Unspecified disorder of eyelid: Secondary | ICD-10-CM | POA: Diagnosis not present

## 2022-04-24 MED ORDER — BETAMETHASONE VALERATE 0.1 % EX OINT: 1.0000 | TOPICAL_OINTMENT | Freq: Every day | CUTANEOUS | 3 refills | Status: DC

## 2022-04-24 MED ORDER — OLOPATADINE HCL 0.2 % OP SOLN: 1.0000 [drp] | Freq: Every day | OPHTHALMIC | 3 refills | Status: DC

## 2022-04-24 NOTE — Progress Notes (Signed)
BP 119/71   Pulse 74   Temp (!) 97.5 F (36.4 C) (Oral)   Ht 5' 6.14" (1.68 m)   Wt 147 lb 3.2 oz (66.8 kg)   SpO2 99%   BMI 23.66 kg/m    Subjective:    Patient ID: Krystal Elliott, female    DOB: [birth year], 67 y.o.   MRN: [id number]  HPI: Krystal Elliott is a 67 y.o. female  Chief Complaint  Patient presents with   Eye Lids swollen    B/L eyelids have been red, swollen, itchy and painful for past 3 weeks   EYE PAIN Has been having swollen eyelids in morning for past 3 weeks.  Worse in morning, but lasts all day.  Has stopped using all her make-up and facial creams to see if benefit, but not helping.  Eyes feel achy and swollen in morning.  Glasses are not working as well lately.  Eyes are very dry.  No pets in home.  Has allergies, takes Singulair.  She will be scheduling with eye provider after  Duration:  weeks Involved eye:  bilateral Foreign body sensation:no Visual impairment: some changes in vision Eye redness:  a little Discharge: no Crusting or matting of eyelids: no Swelling: yes Photophobia: no Itching: yes Tearing: no Headache: no Floaters: no URI symptoms: no Contact lens use: no Close contacts with similar problems: no Eye trauma: no Aggravating factors: morning and night Alleviating factors: as above Status: stable Treatments attempted: as above  Relevant past medical, surgical, family and social history reviewed and updated as indicated. Interim medical history since our last visit reviewed. Allergies and medications reviewed and updated.  Review of Systems  Constitutional:  Negative for activity change, appetite change, diaphoresis, fatigue and fever.  Eyes:  Positive for redness and itching. Negative for photophobia, pain, discharge and visual disturbance.  Respiratory:  Negative for cough, chest tightness and shortness of breath.   Cardiovascular:  Negative for chest pain, palpitations and leg swelling.  Neurological: Negative.    Psychiatric/Behavioral: Negative.      Per HPI unless specifically indicated above     Objective:    BP 119/71   Pulse 74   Temp (!) 97.5 F (36.4 C) (Oral)   Ht 5' 6.14" (1.68 m)   Wt 147 lb 3.2 oz (66.8 kg)   SpO2 99%   BMI 23.66 kg/m   Wt Readings from Last 3 Encounters:  04/24/22 147 lb 3.2 oz (66.8 kg)  03/12/22 148 lb 3.2 oz (67.2 kg)  08/16/21 145 lb 12.8 oz (66.1 kg)    Physical Exam Vitals and nursing note reviewed.  Constitutional:      General: She is awake. She is not in acute distress.    Appearance: She is well-developed and well-groomed. She is not ill-appearing or toxic-appearing.  HENT:     Head: Normocephalic.     Right Ear: Hearing normal.     Left Ear: Hearing normal.     Nose: Nose normal.     Mouth/Throat:     Mouth: Mucous membranes are moist.  Eyes:     General: Lids are normal. Lids are everted, no foreign bodies appreciated. No visual field deficit.       Right eye: No foreign body, discharge or hordeolum.        Left eye: No foreign body, discharge or hordeolum.     Extraocular Movements: Extraocular movements intact.     Conjunctiva/sclera: Conjunctivae normal.     Pupils: Pupils are equal, round,  and reactive to light.     Visual Fields: Right eye visual fields normal and left eye visual fields normal.     Comments: Bilateral upper lids with erythema present L>R.  L eyelid with mild scaling appearance to erythema to inner aspect.  No drainage.  Moderate tearing.  Neck:     Thyroid: No thyromegaly.     Vascular: No carotid bruit or JVD.  Cardiovascular:     Rate and Rhythm: Normal rate and regular rhythm.     Heart sounds: Normal heart sounds. No murmur heard.    No gallop.  Pulmonary:     Effort: Pulmonary effort is normal.     Breath sounds: Normal breath sounds.  Abdominal:     General: Bowel sounds are normal.     Palpations: Abdomen is soft. There is no hepatomegaly or splenomegaly.  Musculoskeletal:     Cervical back: Normal  range of motion and neck supple.     Right lower leg: No edema.     Left lower leg: No edema.  Lymphadenopathy:     Cervical: No cervical adenopathy.  Skin:    General: Skin is warm and dry.  Neurological:     Mental Status: She is alert and oriented to person, place, and time.  Psychiatric:        Attention and Perception: Attention normal.        Mood and Affect: Mood normal.        Behavior: Behavior normal. Behavior is cooperative.        Thought Content: Thought content normal.        Judgment: Judgment normal.     Results for orders placed or performed in visit on 03/12/22  Comp Met (CMET)  Result Value Ref Range   Glucose 90 70 - 99 mg/dL   BUN 11 8 - 27 mg/dL   Creatinine, Ser 0.88 0.57 - 1.00 mg/dL   eGFR 73 >59 mL/min/1.73   BUN/Creatinine Ratio 13 12 - 28   Sodium 137 134 - 144 mmol/L   Potassium 4.2 3.5 - 5.2 mmol/L   Chloride 99 96 - 106 mmol/L   CO2 24 20 - 29 mmol/L   Calcium 9.3 8.7 - 10.3 mg/dL   Total Protein 6.9 6.0 - 8.5 g/dL   Albumin 4.5 3.9 - 4.9 g/dL   Globulin, Total 2.4 1.5 - 4.5 g/dL   Albumin/Globulin Ratio 1.9 1.2 - 2.2   Bilirubin Total 0.6 0.0 - 1.2 mg/dL   Alkaline Phosphatase 82 44 - 121 IU/L   AST 14 0 - 40 IU/L   ALT 17 0 - 32 IU/L  Lipid Profile  Result Value Ref Range   Cholesterol, Total 224 (H) 100 - 199 mg/dL   Triglycerides 99 0 - 149 mg/dL   HDL 83 >39 mg/dL   VLDL Cholesterol Cal 17 5 - 40 mg/dL   LDL Chol Calc (NIH) 124 (H) 0 - 99 mg/dL   Chol/HDL Ratio 2.7 0.0 - 4.4 ratio      Assessment & Plan:   Problem List Items Addressed This Visit       Other   Eyelid abnormality - Primary    Acute for a few weeks.  ?allergy related vs mild eczema.  Will start Pataday daily to both eyes and start Valisone ointment (low potency), discussed with her and educated.  Recommend she schedule with eye doctor once insurance changes.  Return in 4 weeks for follow-up.        Follow  up plan: Return in about 4 weeks (around 05/22/2022)  for Eyelid check.

## 2022-04-24 NOTE — Patient Instructions (Addendum)
[facility] -- look into them Dr. [facility]  Dry eye, also called keratoconjunctivitis sicca, is a condition caused by dryness of the membranes surrounding the eye. It happens when there are not enough healthy, natural tears in the eyes. The eyes must remain moist at all times for good comfort and vision. A small amount of tears is constantly produced by the tear glands (lacrimal glands). These glands are mainly located under the outside part of the upper eyelids. The eyelids produce oils that coat the tears to keep them from evaporating quickly. If the eyelids are inflamed (blepharitis), the lack of healthy oils can make the dry eye worse. Dry eye can happen on its own or be a symptom of several conditions, such as rheumatoid arthritis, lupus, or Sjgren's syndrome. Dry eye may be mild to severe. What are the causes? This condition may be caused by: Not making enough tears (aqueous tear-deficient dry eyes). Tears evaporating from the eyes too quickly (evaporative dry eyes). This is when there is an abnormality in the quality of your tears, especially the oils. This abnormality causes your tears to evaporate so quickly that the eyes cannot be kept moist. What increases the risk? You are more likely to develop this condition if you: Are a woman, especially if you have gone through menopause. Are older. Live in a dry climate. Live or work in a dusty or smoky area. Take certain medicines, such as: Anti-allergy medicines (antihistamines). Blood pressure medicines (antihypertensives), especially "water pills" (diuretics). Birth control pills (oral contraceptives). Laxatives. Tranquilizers. Have eyelid inflammation (blepharitis). Have a history of refractive eye surgery, such as LASIK. Have a history of long-term contact lens use. What are the signs or symptoms? Symptoms of this condition include: Irritation. You may feel: Itchiness. Burning. A feeling as though something is stuck in  the eye. Redness. Inflammation of the eyelids. Light sensitivity. Increased sensitivity and discomfort when wearing contact lenses. Vision that varies throughout the day. Occasional excessive tearing. How is this diagnosed? This condition is diagnosed based on your symptoms, your medical history, and an eye exam. Your health care provider may look at your eye using a microscope and may put dyes in your eye to check the health of the surface of your eye. You may have tests, such as a test to evaluate your tear production (Schirmer test). During this test: A small strip of special paper is gently pressed partly under your lower eyelid. Your tear production is measured by how much of the paper is moistened by your tears during a set amount of time. You may be referred to a health care provider who specializes in medical and surgical eye care (ophthalmologist). How is this treated? Treatment for this condition depends on the type and severity of the dry eyes. To help relieve your symptoms, your health care provider may recommend over-the-counter artificial tears. Artificial tears either come in bottles that have mild preservatives or in small vials or bottles without preservatives. Patients with mild dry eye may do well with tears that have preservatives, while those with more severe dry eye should just use tears without preservatives. Note that one vial may be used several times a day, but should be discarded at the end of the day. If your condition is severe, treatment may also include: Prescription eye drops. Over-the-counter or prescription gels or ointments to moisten your eyes. A prescription nasal spray that increases tear production. Minor surgery to place plugs into the tear drainage ducts. This keep  tears from exiting the eye so that tears can stay on the surface of the eye longer. Medicines to reduce inflammation of the eyelids. Taking an omega-3 fatty acid nutritional supplement. Other  treatments include making tears from your own blood (autologous serum tears), wearing special contact lenses, and even having minor surgery to partially close the outer parts of your eyelids to decrease evaporation. Follow these instructions at home: Take or apply over-the-counter and prescription medicines only as told by your health care provider. This includes eye drops. If directed, apply a warm compress to your eyes to help reduce eyelid inflammation. Place a towel over your eyes and gently press the warm compress over your eyes for about 5 minutes, or as long as told by your health care provider. Drink plenty of fluids to stay well hydrated. If possible, avoid dry, drafty environments. Wear sunglasses when outdoors to protect your eyes from the sun and wind. Use a humidifier at home to increase moisture in the air. Remember to blink often when reading or using the computer for long periods. If you wear contact lenses, remove them regularly to give your eyes a break. Always remove your contact lenses before sleeping. Have a yearly eye exam and vision test. Keep all follow-up visits. This is important. Contact a health care provider if: You have eye pain. You have pus-like fluid coming from your eye. Your symptoms get worse or do not improve with treatment. Get help right away if: Your vision suddenly changes. Summary Dry eye is dryness of the membranes surrounding the eye. Dry eye can happen on its own or be a symptom of several conditions, such as rheumatoid arthritis, lupus, or Sjgren's syndrome. This condition is diagnosed based on your symptoms, your medical history, and an eye exam. Treatment for this condition depends on the type and severity of the dry eye. To help relieve your symptoms, your health care provider may recommend over-the-counter artificial tears. This information is not intended to replace advice given to you by your health care provider. Make sure you discuss any  questions you have with your health care provider. Document Revised: 08/02/2020 Document Reviewed: 08/02/2020 Elsevier Patient Education  [employer].

## 2022-04-24 NOTE — Assessment & Plan Note (Signed)
Acute for a few weeks.  ?allergy related vs mild eczema.  Will start Pataday daily to both eyes and start Valisone ointment (low potency), discussed with her and educated.  Recommend she schedule with eye doctor once insurance changes.  Return in 4 weeks for follow-up.

## 2022-05-01 ENCOUNTER — Ambulatory Visit
Admission: RE | Admit: 2022-05-01 | Discharge: 2022-05-01 | Disposition: A | Discharge status: Home / Self Care | Payer: 59 | Source: Ambulatory Visit | Attending: Nurse Practitioner | Admitting: Nurse Practitioner

## 2022-05-01 DIAGNOSIS — Z1231 Encounter for screening mammogram for malignant neoplasm of breast: Secondary | ICD-10-CM | POA: Insufficient documentation

## 2022-05-03 NOTE — Progress Notes (Signed)
Please let patient know her Mammogram did not show any evidence of a malignancy.  The recommendation is to repeat the Mammogram in 1 year.  

## 2022-05-22 ENCOUNTER — Ambulatory Visit: Payer: 59 | Admitting: Nurse Practitioner

## 2022-06-13 DIAGNOSIS — M9903 Segmental and somatic dysfunction of lumbar region: Secondary | ICD-10-CM | POA: Diagnosis not present

## 2022-06-13 DIAGNOSIS — M9901 Segmental and somatic dysfunction of cervical region: Secondary | ICD-10-CM | POA: Diagnosis not present

## 2022-06-13 DIAGNOSIS — M542 Cervicalgia: Secondary | ICD-10-CM | POA: Diagnosis not present

## 2022-06-13 DIAGNOSIS — M5416 Radiculopathy, lumbar region: Secondary | ICD-10-CM | POA: Diagnosis not present

## 2022-07-25 DIAGNOSIS — M542 Cervicalgia: Secondary | ICD-10-CM | POA: Diagnosis not present

## 2022-07-25 DIAGNOSIS — M9903 Segmental and somatic dysfunction of lumbar region: Secondary | ICD-10-CM | POA: Diagnosis not present

## 2022-07-25 DIAGNOSIS — M9901 Segmental and somatic dysfunction of cervical region: Secondary | ICD-10-CM | POA: Diagnosis not present

## 2022-07-25 DIAGNOSIS — M5416 Radiculopathy, lumbar region: Secondary | ICD-10-CM | POA: Diagnosis not present

## 2022-08-01 DIAGNOSIS — Q141 Congenital malformation of retina: Secondary | ICD-10-CM | POA: Diagnosis not present

## 2022-08-01 DIAGNOSIS — H5203 Hypermetropia, bilateral: Secondary | ICD-10-CM | POA: Diagnosis not present

## 2022-08-01 DIAGNOSIS — Z0100 Encounter for examination of eyes and vision without abnormal findings: Secondary | ICD-10-CM | POA: Diagnosis not present

## 2022-08-01 DIAGNOSIS — H04123 Dry eye syndrome of bilateral lacrimal glands: Secondary | ICD-10-CM | POA: Diagnosis not present

## 2022-08-22 ENCOUNTER — Other Ambulatory Visit: Payer: Self-pay | Admitting: Nurse Practitioner

## 2022-08-22 NOTE — Telephone Encounter (Signed)
Requested Prescriptions  Pending Prescriptions Disp Refills   albuterol (VENTOLIN HFA) 108 (90 Base) MCG/ACT inhaler [Pharmacy Med Name: ALBUTEROL HFA (VENTOLIN) INH] 18 g 1    Sig: INHALE 1-2 PUFFS BY MOUTH EVERY 6 HOURS AS NEEDED FOR WHEEZE OR SHORTNESS OF BREATH     Pulmonology:  Beta Agonists 2 Passed - 08/22/2022  2:23 AM      Passed - Last BP in normal range    BP Readings from Last 1 Encounters:  04/24/22 119/71         Passed - Last Heart Rate in normal range    Pulse Readings from Last 1 Encounters:  04/24/22 74         Passed - Valid encounter within last 12 months    Recent Outpatient Visits           4 months ago Eyelid abnormality   [place] [facility] [place], Corrie Dandy T, NP   5 months ago Anxiety   [place] [facility] Larae Grooms, NP   1 year ago Annual physical exam   [place] [facility] Larae Grooms, NP   1 year ago Encounter to establish care   [place] [facility] Larae Grooms, NP       Future Appointments             In 2 weeks Larae Grooms, NP [facility], [facility]

## 2022-08-29 ENCOUNTER — Other Ambulatory Visit: Payer: Self-pay | Admitting: Nurse Practitioner

## 2022-08-30 NOTE — Telephone Encounter (Signed)
Requested Prescriptions  Pending Prescriptions Disp Refills   citalopram (CELEXA) 10 MG tablet [Pharmacy Med Name: CITALOPRAM HBR 10 MG TABLET] 90 tablet 0    Sig: TAKE 1 TABLET BY MOUTH EVERY DAY     Psychiatry:  Antidepressants - SSRI Passed - 08/29/2022  1:31 PM      Passed - Completed PHQ-2 or PHQ-9 in the last 360 days      Passed - Valid encounter within last 6 months    Recent Outpatient Visits           4 months ago Eyelid abnormality   [place] [facility] [place], Corrie Dandy T, NP   5 months ago Anxiety   [place] [facility] Larae Grooms, NP   1 year ago Annual physical exam   [place] [facility] Larae Grooms, NP   1 year ago Encounter to establish care   [place] [facility] Larae Grooms, NP       Future Appointments             In 1 week Larae Grooms, NP [facility], [facility]

## 2022-09-10 NOTE — Progress Notes (Unsigned)
There were no vitals taken for this visit.   Subjective:    Patient ID: Krystal Elliott, female    DOB: September 29, 1955, 67 y.o.   MRN: [id number]  HPI: Krystal Elliott is a 67 y.o. female presenting on 09/11/2022 for comprehensive medical examination. Current medical complaints include:{Blank single:19197::"none","***"}  She currently lives with: Menopausal Symptoms: {Blank single:19197::"yes","no"}  MOOD Patient states she has been doing well.  Feels like the Celexa is working well for her.  Denies concerns regarding her medication.  Denies SI.   HYPERLIPIDEMIA Hyperlipidemia status: {Blank single:19197::"excellent compliance","good compliance","fair compliance","poor compliance"} Satisfied with current treatment?  {Blank single:19197::"yes","no"} Side effects:  {Blank single:19197::"yes","no"} Medication compliance: {Blank single:19197::"excellent compliance","good compliance","fair compliance","poor compliance"} Past cholesterol meds: {Blank multiple:19196::"none","atorvastain (lipitor)","lovastatin (mevacor)","pravastatin (pravachol)","rosuvastatin (crestor)","simvastatin (zocor)","vytorin","fenofibrate (tricor)","gemfibrozil","ezetimide (zetia)","niaspan","lovaza"} Supplements: {Blank multiple:19196::"none","fish oil","niacin","red yeast rice"} Aspirin:  {Blank single:19197::"yes","no"} The 10-year ASCVD risk score (Arnett DK, et al., 2019) is: 4.3%   Values used to calculate the score:     Age: 7 years     Sex: Female     Is Non-Hispanic African American: No     Diabetic: No     Tobacco smoker: No     Systolic Blood Pressure: 119 mmHg     Is BP treated: No     HDL Cholesterol: 83 mg/dL     Total Cholesterol: 224 mg/dL Chest pain:  {Blank WUJWJX:91478::"GNF","AO"} Coronary artery disease:  {Blank single:19197::"yes","no"} Family history CAD:  {Blank single:19197::"yes","no"} Family history early CAD:  {Blank single:19197::"yes","no"}   Functional Status Survey:        04/24/2022   10:56 AM 03/12/2022    2:38 PM 08/16/2021    1:34 PM 07/13/2021    8:48 AM  Fall Risk   Falls in the past year? 0 0 0 0  Number falls in past yr: 0 0 0 0  Injury with Fall? 0 0 0 0  Risk for fall due to : No Fall Risks No Fall Risks No Fall Risks History of fall(s)  Follow up Falls evaluation completed Falls evaluation completed Falls evaluation completed Falls evaluation completed    Depression Screen    04/24/2022   10:56 AM 03/12/2022    2:38 PM 08/16/2021    1:34 PM 07/13/2021    8:50 AM 07/13/2021    8:49 AM  Depression screen PHQ 2/9  Decreased Interest 0 0 0 0 0  Down, Depressed, Hopeless 0 0 0 0 0  PHQ - 2 Score 0 0 0 0 0  Altered sleeping 0 0 0 0 0  Tired, decreased energy 0 1 0 1 0  Change in appetite 0 0 0 0 0  Feeling bad or failure about yourself  0 0 0 0 1  Trouble concentrating 0 0 0 0 0  Moving slowly or fidgety/restless 0 0 0 0 0  Suicidal thoughts 0 0 0 0 0  PHQ-9 Score 0 1 0 1 1  Difficult doing work/chores Not difficult at all Not difficult at all Not difficult at all Somewhat difficult Not difficult at all     Advanced Directives Does patient have a HCPOA?    {Blank single:19197::"yes","no"} If yes, name and contact information:  Does patient have a living will or MOST form?  {Blank single:19197::"yes","no"}  Past Medical History:  Past Medical History:  Diagnosis Date   Allergy    Asthma     Surgical History:  Past Surgical History:  Procedure Laterality Date   TUBAL LIGATION      Medications:  Current Outpatient Medications  on File Prior to Visit  Medication Sig   albuterol (VENTOLIN HFA) 108 (90 Base) MCG/ACT inhaler INHALE 1-2 PUFFS BY MOUTH EVERY 6 HOURS AS NEEDED FOR WHEEZE OR SHORTNESS OF BREATH   betamethasone valerate ointment (VALISONE) 0.1 % Apply 1 Application topically daily. Small amount to both eyelids, avoid getting directly into eye.  Stop in 10 days.   Cholecalciferol (VITAMIN D3) 10 MCG (400 UNIT) tablet Take by  mouth.   citalopram (CELEXA) 10 MG tablet TAKE 1 TABLET BY MOUTH EVERY DAY   montelukast (SINGULAIR) 10 MG tablet TAKE 1 TABLET BY MOUTH EVERYDAY AT BEDTIME   Olopatadine HCl 0.2 % SOLN Apply 1 drop to eye daily. To both eyes.   No current facility-administered medications on file prior to visit.    Allergies:  Allergies  Allergen Reactions   Penicillins Rash   Sulfa Antibiotics Rash    Social History:  Social History   Socioeconomic History   Marital status: Unknown    Spouse name: Not on file   Number of children: Not on file   Years of education: Not on file   Highest education level: Not on file  Occupational History   Not on file  Tobacco Use   Smoking status: Never   Smokeless tobacco: Never  Vaping Use   Vaping Use: Never used  Substance and Sexual Activity   Alcohol use: Yes   Drug use: Not Currently   Sexual activity: Yes  Other Topics Concern   Not on file  Social History Narrative   Not on file   Social Determinants of Health   Financial Resource Strain: Not on file  Food Insecurity: Not on file  Transportation Needs: Not on file  Physical Activity: Not on file  Stress: Not on file  Social Connections: Not on file  Intimate Partner Violence: Not on file   Social History   Tobacco Use  Smoking Status Never  Smokeless Tobacco Never   Social History   Substance and Sexual Activity  Alcohol Use Yes    Family History:  No family history on file.  Past medical history, surgical history, medications, allergies, family history and social history reviewed with patient today and changes made to appropriate areas of the chart.   ROS  All other ROS negative except what is listed above and in the HPI.      Objective:    There were no vitals taken for this visit.  Wt Readings from Last 3 Encounters:  04/24/22 147 lb 3.2 oz (66.8 kg)  03/12/22 148 lb 3.2 oz (67.2 kg)  08/16/21 145 lb 12.8 oz (66.1 kg)    No results found.  Physical  Exam      No data to display          Cognitive Testing - 6-CIT  Correct? Score   What year is it? {YES NO:22349} {Numbers; 0-4:31231} Yes = 0    No = 4  What month is it? {YES NO:22349} {Numbers; 0-4:31231} Yes = 0    No = 3  Remember:     Krystal Elliott, [address], [place]     What time is it? {YES NO:22349} {Numbers; 0-4:31231} Yes = 0    No = 3  Count backwards from 20 to 1 {YES NO:22349} {Numbers; 0-4:31231} Correct = 0    1 error = 2   More than 1 error = 4  Say the months of the year in reverse. {YES NO:22349} {Numbers; 0-4:31231} Correct =  0    1 error = 2   More than 1 error = 4  What address did I ask you to remember? {YES NO:22349} {NUMBERS; 0-10:5044} Correct = 0  1 error = 2    2 error = 4    3 error = 6    4 error = 8    All wrong = 10       TOTAL SCORE  {Numbers; 4-09:81191}/47   Interpretation:  {Desc; normal/abnormal:11317::"Normal"}  Normal (0-7) Abnormal (8-28)   Results for orders placed or performed in visit on 03/12/22  Comp Met (CMET)  Result Value Ref Range   Glucose 90 70 - 99 mg/dL   BUN 11 8 - 27 mg/dL   Creatinine, Ser 8.29 0.57 - 1.00 mg/dL   eGFR 73 >56 OZ/HYQ/6.57   BUN/Creatinine Ratio 13 12 - 28   Sodium 137 134 - 144 mmol/L   Potassium 4.2 3.5 - 5.2 mmol/L   Chloride 99 96 - 106 mmol/L   CO2 24 20 - 29 mmol/L   Calcium 9.3 8.7 - 10.3 mg/dL   Total Protein 6.9 6.0 - 8.5 g/dL   Albumin 4.5 3.9 - 4.9 g/dL   Globulin, Total 2.4 1.5 - 4.5 g/dL   Albumin/Globulin Ratio 1.9 1.2 - 2.2   Bilirubin Total 0.6 0.0 - 1.2 mg/dL   Alkaline Phosphatase 82 44 - 121 IU/L   AST 14 0 - 40 IU/L   ALT 17 0 - 32 IU/L  Lipid Profile  Result Value Ref Range   Cholesterol, Total 224 (H) 100 - 199 mg/dL   Triglycerides 99 0 - 149 mg/dL   HDL 83 >84 mg/dL   VLDL Cholesterol Cal 17 5 - 40 mg/dL   LDL Chol Calc (NIH) 696 (H) 0 - 99 mg/dL   Chol/HDL Ratio 2.7 0.0 - 4.4 ratio      Assessment & Plan:   Problem List Items Addressed This Visit   None     Preventative Services:  AAA screening:  Health Risk Assessment and Personalized Prevention Plan: Bone Mass Measurements: Breast Cancer Screening: CVD Screening:  Cervical Cancer Screening: Colon Cancer Screening:  Depression Screening:  Diabetes Screening:  Glaucoma Screening:  Hepatitis B vaccine: Hepatitis C screening:  HIV Screening: Flu Vaccine: Lung cancer Screening: Obesity Screening:  Pneumonia Vaccines (2): STI Screening:  Follow up plan: No follow-ups on file.   LABORATORY TESTING:  - Pap smear: {Blank single:19197::"pap done","not applicable","up to date","done elsewhere"}  IMMUNIZATIONS:   - Tdap: Tetanus vaccination status reviewed: {tetanus status:315746}. - Influenza: {Blank single:19197::"Up to date","Administered today","Postponed to flu season","Refused","Given elsewhere"} - Pneumovax: {Blank single:19197::"Up to date","Administered today","Not applicable","Refused","Given elsewhere"} - Prevnar: {Blank single:19197::"Up to date","Administered today","Not applicable","Refused","Given elsewhere"} - Zostavax vaccine: {Blank single:19197::"Up to date","Administered today","Not applicable","Refused","Given elsewhere"}  SCREENING: -Mammogram: {Blank single:19197::"Up to date","Ordered today","Not applicable","Refused","Done elsewhere"}  - Colonoscopy: {Blank single:19197::"Up to date","Ordered today","Not applicable","Refused","Done elsewhere"}  - Bone Density: {Blank single:19197::"Up to date","Ordered today","Not applicable","Refused","Done elsewhere"}  -Hearing Test: {Blank single:19197::"Up to date","Ordered today","Not applicable","Refused","Done elsewhere"}  -Spirometry: {Blank single:19197::"Up to date","Ordered today","Not applicable","Refused","Done elsewhere"}   PATIENT COUNSELING:   Advised to take 1 mg of folate supplement per day if capable of pregnancy.   Sexuality: Discussed sexually transmitted diseases, partner selection, use of condoms,  avoidance of unintended pregnancy  and contraceptive alternatives.   Advised to avoid cigarette smoking.  I discussed with the patient that most people either abstain from alcohol or drink within safe limits (<=14/week and <=4 drinks/occasion for males, <=7/weeks and <=  3 drinks/occasion for females) and that the risk for alcohol disorders and other health effects rises proportionally with the number of drinks per week and how often a drinker exceeds daily limits.  Discussed cessation/primary prevention of drug use and availability of treatment for abuse.   Diet: Encouraged to adjust caloric intake to maintain  or achieve ideal body weight, to reduce intake of dietary saturated fat and total fat, to limit sodium intake by avoiding high sodium foods and not adding table salt, and to maintain adequate dietary potassium and calcium preferably from fresh fruits, vegetables, and low-fat dairy products.    stressed the importance of regular exercise  Injury prevention: Discussed safety belts, safety helmets, smoke detector, smoking near bedding or upholstery.   Dental health: Discussed importance of regular tooth brushing, flossing, and dental visits.    NEXT PREVENTATIVE PHYSICAL DUE IN 1 YEAR. No follow-ups on file.

## 2022-09-11 ENCOUNTER — Ambulatory Visit: Payer: Medicare HMO | Admitting: Nurse Practitioner

## 2022-09-11 ENCOUNTER — Telehealth: Payer: Self-pay

## 2022-09-11 ENCOUNTER — Encounter: Payer: Self-pay | Admitting: Nurse Practitioner

## 2022-09-11 ENCOUNTER — Other Ambulatory Visit: Payer: Self-pay

## 2022-09-11 VITALS — BP 113/77 | HR 71 | Temp 97.9°F | Ht 66.14 in | Wt 145.8 lb

## 2022-09-11 DIAGNOSIS — Z780 Asymptomatic menopausal state: Secondary | ICD-10-CM

## 2022-09-11 DIAGNOSIS — F419 Anxiety disorder, unspecified: Secondary | ICD-10-CM | POA: Diagnosis not present

## 2022-09-11 DIAGNOSIS — Z1211 Encounter for screening for malignant neoplasm of colon: Secondary | ICD-10-CM

## 2022-09-11 DIAGNOSIS — Z0000 Encounter for general adult medical examination without abnormal findings: Secondary | ICD-10-CM

## 2022-09-11 DIAGNOSIS — Z7189 Other specified counseling: Secondary | ICD-10-CM

## 2022-09-11 DIAGNOSIS — E7800 Pure hypercholesterolemia, unspecified: Secondary | ICD-10-CM | POA: Diagnosis not present

## 2022-09-11 DIAGNOSIS — F339 Major depressive disorder, recurrent, unspecified: Secondary | ICD-10-CM

## 2022-09-11 HISTORY — DX: Other specified counseling: Z71.89

## 2022-09-11 LAB — MICROSCOPIC EXAMINATION: Bacteria, UA: NONE SEEN

## 2022-09-11 LAB — URINALYSIS, ROUTINE W REFLEX MICROSCOPIC
Bilirubin, UA: NEGATIVE
Glucose, UA: NEGATIVE
Ketones, UA: NEGATIVE
Leukocytes,UA: NEGATIVE
Nitrite, UA: NEGATIVE
Protein,UA: NEGATIVE
Specific Gravity, UA: 1.01 (ref 1.005–1.030)
Urobilinogen, Ur: 0.2 mg/dL (ref 0.2–1.0)
pH, UA: 6 (ref 5.0–7.5)

## 2022-09-11 MED ORDER — NA SULFATE-K SULFATE-MG SULF 17.5-3.13-1.6 GM/177ML PO SOLN: 1.0000 | Freq: Once | ORAL | 0 refills | Status: AC

## 2022-09-11 MED ORDER — CITALOPRAM HYDROBROMIDE 10 MG PO TABS: 10.0000 mg | ORAL_TABLET | Freq: Every day | ORAL | 1 refills | Status: DC

## 2022-09-11 NOTE — Assessment & Plan Note (Signed)
A voluntary discussion about advance care planning including the explanation and discussion of advance directives was extensively discussed  with the patient for 5 minutes with patient.  Explanation about the health care proxy and Living will was reviewed and packet with forms with explanation of how to fill them out was given.  During this discussion, the patient plans to fill out the paperwork required.  Patient was offered a separate Advance Care Planning visit for further assistance with forms.    

## 2022-09-11 NOTE — Progress Notes (Signed)
Results discussed with patient during visit.

## 2022-09-11 NOTE — Telephone Encounter (Signed)
Gastroenterology Pre-Procedure Review  Request Date: 10/03/22 Requesting Physician: Dr. Servando Snare  PATIENT REVIEW QUESTIONS: The patient responded to the following health history questions as indicated:    1. Are you having any GI issues? no 2. Do you have a personal history of Polyps? no 3. Do you have a family history of Colon Cancer or Polyps? no 4. Diabetes Mellitus? no 5. Joint replacements in the past 12 months?no 6. Major health problems in the past 3 months?no 7. Any artificial heart valves, MVP, or defibrillator?no    MEDICATIONS & ALLERGIES:    Patient reports the following regarding taking any anticoagulation/antiplatelet therapy:   Plavix, Coumadin, Eliquis, Xarelto, Lovenox, Pradaxa, Brilinta, or Effient? no Aspirin? no  Patient confirms/reports the following medications:  Current Outpatient Medications  Medication Sig Dispense Refill   albuterol (VENTOLIN HFA) 108 (90 Base) MCG/ACT inhaler INHALE 1-2 PUFFS BY MOUTH EVERY 6 HOURS AS NEEDED FOR WHEEZE OR SHORTNESS OF BREATH 18 g 1   betamethasone valerate ointment (VALISONE) 0.1 % Apply 1 Application topically daily. Small amount to both eyelids, avoid getting directly into eye.  Stop in 10 days. 15 g 3   Cholecalciferol (VITAMIN D3) 10 MCG (400 UNIT) tablet Take by mouth.     citalopram (CELEXA) 10 MG tablet Take 1 tablet (10 mg total) by mouth daily. 90 tablet 1   montelukast (SINGULAIR) 10 MG tablet TAKE 1 TABLET BY MOUTH EVERYDAY AT BEDTIME 90 tablet 1   No current facility-administered medications for this visit.    Patient confirms/reports the following allergies:  Allergies  Allergen Reactions   Penicillins Rash   Sulfa Antibiotics Rash    No orders of the defined types were placed in this encounter.   AUTHORIZATION INFORMATION Primary Insurance: 1D#: Group #:  Secondary Insurance: 1D#: Group #:  SCHEDULE INFORMATION: Date: 10/03/22 Time: Location: [facility]

## 2022-09-11 NOTE — Assessment & Plan Note (Signed)
Chronic.  Controlled.  Continue with current medication regimen of Celexa 10mg daily.  Refills sent today.  Labs ordered today.  Return to clinic in 6 months for reevaluation.  Call sooner if concerns arise.  ? ?

## 2022-09-11 NOTE — Assessment & Plan Note (Signed)
Labs ordered at visit today.  Will make recommendations based on lab results.   

## 2022-09-12 DIAGNOSIS — M5416 Radiculopathy, lumbar region: Secondary | ICD-10-CM | POA: Diagnosis not present

## 2022-09-12 DIAGNOSIS — M9903 Segmental and somatic dysfunction of lumbar region: Secondary | ICD-10-CM | POA: Diagnosis not present

## 2022-09-12 DIAGNOSIS — M9901 Segmental and somatic dysfunction of cervical region: Secondary | ICD-10-CM | POA: Diagnosis not present

## 2022-09-12 DIAGNOSIS — M542 Cervicalgia: Secondary | ICD-10-CM | POA: Diagnosis not present

## 2022-09-12 LAB — LIPID PANEL
Chol/HDL Ratio: 2.3 ratio (ref 0.0–4.4)
Cholesterol, Total: 234 mg/dL — ABNORMAL HIGH (ref 100–199)
HDL: 102 mg/dL (ref >39)
LDL Chol Calc (NIH): 117 mg/dL — ABNORMAL HIGH (ref 0–99)
Triglycerides: 91 mg/dL (ref 0–149)
VLDL Cholesterol Cal: 15 mg/dL (ref 5–40)

## 2022-09-12 LAB — COMPREHENSIVE METABOLIC PANEL
ALT: 15 IU/L (ref 0–32)
AST: 18 IU/L (ref 0–40)
Albumin: 4.7 g/dL (ref 3.9–4.9)
Alkaline Phosphatase: 69 IU/L (ref 44–121)
BUN/Creatinine Ratio: 18 (ref 12–28)
BUN: 17 mg/dL (ref 8–27)
Bilirubin Total: 0.6 mg/dL (ref 0.0–1.2)
CO2: 23 mmol/L (ref 20–29)
Calcium: 9.2 mg/dL (ref 8.7–10.3)
Chloride: 101 mmol/L (ref 96–106)
Creatinine, Ser: 0.92 mg/dL (ref 0.57–1.00)
Globulin, Total: 1.9 g/dL (ref 1.5–4.5)
Glucose: 82 mg/dL (ref 70–99)
Potassium: 4 mmol/L (ref 3.5–5.2)
Sodium: 138 mmol/L (ref 134–144)
Total Protein: 6.6 g/dL (ref 6.0–8.5)
eGFR: 69 mL/min/{1.73_m2} (ref >59)

## 2022-09-12 LAB — CBC WITH DIFFERENTIAL/PLATELET
Basophils Absolute: 0.1 10*3/uL (ref 0.0–0.2)
Basos: 1 %
EOS (ABSOLUTE): 0.3 10*3/uL (ref 0.0–0.4)
Eos: 5 %
Hematocrit: 38.1 % (ref 34.0–46.6)
Hemoglobin: 12.9 g/dL (ref 11.1–15.9)
Immature Grans (Abs): 0 10*3/uL (ref 0.0–0.1)
Immature Granulocytes: 0 %
Lymphocytes Absolute: 2.1 10*3/uL (ref 0.7–3.1)
Lymphs: 33 %
MCH: 31.5 pg (ref 26.6–33.0)
MCHC: 33.9 g/dL (ref 31.5–35.7)
MCV: 93 fL (ref 79–97)
Monocytes Absolute: 0.7 10*3/uL (ref 0.1–0.9)
Monocytes: 11 %
Neutrophils Absolute: 3.2 10*3/uL (ref 1.4–7.0)
Neutrophils: 50 %
Platelets: 270 10*3/uL (ref 150–450)
RBC: 4.09 x10E6/uL (ref 3.77–5.28)
RDW: 11.7 % (ref 11.7–15.4)
WBC: 6.3 10*3/uL (ref 3.4–10.8)

## 2022-09-12 LAB — TSH: TSH: 1.36 u[IU]/mL (ref 0.450–4.500)

## 2022-09-12 NOTE — Progress Notes (Signed)
Hi Ms. Krystal Elliott. It was nice to see you yesterday.  Your lab work looks good.  Cholesterol is relatively the same as prior.  I recommend a low fat diet and exercise.  No concerns at this time. Continue with your current medication regimen.  Follow up as discussed.  Please let me know if you have any questions.

## 2022-09-20 NOTE — Telephone Encounter (Signed)
Patient called in to reschedule her colonoscopy. She stated that she is having a hard time driving a ride.

## 2022-09-25 ENCOUNTER — Encounter: Payer: Self-pay | Admitting: Gastroenterology

## 2022-10-03 ENCOUNTER — Encounter
Admission: RE | Disposition: A | Discharge status: Home / Self Care | Payer: Self-pay | Source: Home / Self Care | Attending: Gastroenterology

## 2022-10-03 ENCOUNTER — Ambulatory Visit: Payer: Medicare HMO | Admitting: Anesthesiology

## 2022-10-03 ENCOUNTER — Ambulatory Visit
Admission: RE | Admit: 2022-10-03 | Discharge: 2022-10-03 | Disposition: A | Discharge status: Home / Self Care | Payer: Medicare HMO | Attending: Gastroenterology | Admitting: Gastroenterology

## 2022-10-03 ENCOUNTER — Encounter: Payer: Self-pay | Admitting: Gastroenterology

## 2022-10-03 DIAGNOSIS — K641 Second degree hemorrhoids: Secondary | ICD-10-CM | POA: Insufficient documentation

## 2022-10-03 DIAGNOSIS — Z1211 Encounter for screening for malignant neoplasm of colon: Secondary | ICD-10-CM

## 2022-10-03 DIAGNOSIS — K5730 Diverticulosis of large intestine without perforation or abscess without bleeding: Secondary | ICD-10-CM | POA: Insufficient documentation

## 2022-10-03 DIAGNOSIS — J45909 Unspecified asthma, uncomplicated: Secondary | ICD-10-CM | POA: Insufficient documentation

## 2022-10-03 DIAGNOSIS — Z79899 Other long term (current) drug therapy: Secondary | ICD-10-CM | POA: Diagnosis not present

## 2022-10-03 DIAGNOSIS — D125 Benign neoplasm of sigmoid colon: Secondary | ICD-10-CM | POA: Insufficient documentation

## 2022-10-03 DIAGNOSIS — K219 Gastro-esophageal reflux disease without esophagitis: Secondary | ICD-10-CM | POA: Insufficient documentation

## 2022-10-03 DIAGNOSIS — K635 Polyp of colon: Secondary | ICD-10-CM

## 2022-10-03 HISTORY — PX: POLYPECTOMY: SHX5525

## 2022-10-03 HISTORY — PX: COLONOSCOPY WITH PROPOFOL: SHX5780

## 2022-10-03 SURGERY — COLONOSCOPY WITH PROPOFOL
Anesthesia: General

## 2022-10-03 MED ORDER — SODIUM CHLORIDE 0.9 % IV SOLN
INTRAVENOUS | Status: DC
  Administered 2022-10-03: 1000 mL via INTRAVENOUS

## 2022-10-03 MED ORDER — PROPOFOL 500 MG/50ML IV EMUL
INTRAVENOUS | Status: DC | PRN
  Administered 2022-10-03: 165 ug/kg/min via INTRAVENOUS

## 2022-10-03 MED ORDER — PROPOFOL 10 MG/ML IV BOLUS
INTRAVENOUS | Status: DC | PRN
  Administered 2022-10-03: 70 mg via INTRAVENOUS
  Administered 2022-10-03: 10 mg via INTRAVENOUS
  Administered 2022-10-03: 20 mg via INTRAVENOUS

## 2022-10-03 MED ORDER — PROPOFOL 1000 MG/100ML IV EMUL
INTRAVENOUS | Status: AC
  Filled 2022-10-03: qty 400

## 2022-10-03 MED ORDER — LIDOCAINE HCL (CARDIAC) PF 100 MG/5ML IV SOSY
PREFILLED_SYRINGE | INTRAVENOUS | Status: DC | PRN
  Administered 2022-10-03: 60 mg via INTRAVENOUS

## 2022-10-03 NOTE — Anesthesia Procedure Notes (Signed)
Procedure Name: General with mask airway Date/Time: 10/03/2022 7:30 AM  Performed by: Mohammed Kindle, CRNAPre-anesthesia Checklist: Patient identified, Emergency Drugs available, Suction available and Patient being monitored Patient Re-evaluated:Patient Re-evaluated prior to induction Oxygen Delivery Method: Simple face mask Induction Type: IV induction Placement Confirmation: positive ETCO2, CO2 detector and breath sounds checked- equal and bilateral Dental Injury: Teeth and Oropharynx as per pre-operative assessment

## 2022-10-03 NOTE — Anesthesia Preprocedure Evaluation (Signed)
Anesthesia Evaluation  Patient identified by MRN, date of birth, ID band Patient awake    Reviewed: Allergy & Precautions, NPO status , Patient's Chart, lab work & pertinent test results  History of Anesthesia Complications Negative for: history of anesthetic complications  Airway Mallampati: III  TM Distance: <3 FB Neck ROM: full    Dental  (+) Chipped   Pulmonary neg shortness of breath, asthma    Pulmonary exam normal        Cardiovascular Exercise Tolerance: Good (-) angina negative cardio ROS Normal cardiovascular exam     Neuro/Psych  PSYCHIATRIC DISORDERS      negative neurological ROS     GI/Hepatic Neg liver ROS,GERD  Controlled,,  Endo/Other  negative endocrine ROS    Renal/GU negative Renal ROS  negative genitourinary   Musculoskeletal   Abdominal   Peds  Hematology negative hematology ROS (+)   Anesthesia Other Findings Past Medical History: 09/11/2022: Advanced care planning/counseling discussion No date: Allergy No date: Anxiety No date: Asthma No date: Cataract No date: GERD (gastroesophageal reflux disease)  Past Surgical History: No date: EYE SURGERY 1996: THYROID LOBECTOMY No date: TUBAL LIGATION  BMI    Body Mass Index: 22.28 kg/m      Reproductive/Obstetrics negative OB ROS                             Anesthesia Physical Anesthesia Plan  ASA: 3  Anesthesia Plan: General   Post-op Pain Management:    Induction: Intravenous  PONV Risk Score and Plan: Propofol infusion and TIVA  Airway Management Planned: Natural Airway and Nasal Cannula  Additional Equipment:   Intra-op Plan:   Post-operative Plan:   Informed Consent: I have reviewed the patients History and Physical, chart, labs and discussed the procedure including the risks, benefits and alternatives for the proposed anesthesia with the patient or authorized representative who has  indicated his/her understanding and acceptance.     Dental Advisory Given  Plan Discussed with: Anesthesiologist, CRNA and Surgeon  Anesthesia Plan Comments: (Patient consented for risks of anesthesia including but not limited to:  - adverse reactions to medications - risk of airway placement if required - damage to eyes, teeth, lips or other oral mucosa - nerve damage due to positioning  - sore throat or hoarseness - Damage to heart, brain, nerves, lungs, other parts of body or loss of life  Patient voiced understanding.)       Anesthesia Quick Evaluation

## 2022-10-03 NOTE — Anesthesia Postprocedure Evaluation (Signed)
Anesthesia Post Note  Patient: [facility]  Procedure(s) Performed: COLONOSCOPY WITH PROPOFOL POLYPECTOMY  Patient location during evaluation: Endoscopy Anesthesia Type: General Level of consciousness: awake and alert Pain management: pain level controlled Vital Signs Assessment: post-procedure vital signs reviewed and stable Respiratory status: spontaneous breathing, nonlabored ventilation, respiratory function stable and patient connected to nasal cannula oxygen Cardiovascular status: blood pressure returned to baseline and stable Postop Assessment: no apparent nausea or vomiting Anesthetic complications: no   No notable events documented.   Last Vitals:  Vitals:   10/03/22 0759 10/03/22 0809  BP: 124/67 131/70  Pulse: 100 89  Resp: 14 18  Temp:    SpO2: 98% 99%    Last Pain:  Vitals:   10/03/22 0809  TempSrc:   PainSc: 0-No pain                 Cleda Mccreedy Alayssa Flinchum

## 2022-10-03 NOTE — Transfer of Care (Signed)
Immediate Anesthesia Transfer of Care Note  Patient: Krystal Elliott  Procedure(s) Performed: COLONOSCOPY WITH PROPOFOL POLYPECTOMY  Patient Location: Endoscopy Unit  Anesthesia Type:General  Level of Consciousness: drowsy and patient cooperative  Airway & Oxygen Therapy: Patient Spontanous Breathing and Patient connected to face mask oxygen  Post-op Assessment: Report given to RN and Post -op Vital signs reviewed and stable  Post vital signs: Reviewed and stable  Last Vitals:  Vitals Value Taken Time  BP 109/65 10/03/22 0750  Temp 35.8 C 10/03/22 0749  Pulse 86 10/03/22 0751  Resp 15 10/03/22 0751  SpO2 100 % 10/03/22 0751  Vitals shown include unvalidated device data.  Last Pain:  Vitals:   10/03/22 0749  TempSrc:   PainSc: Asleep         Complications: No notable events documented.

## 2022-10-03 NOTE — H&P (Signed)
Krystal Minium, MD [facility] [address] [place], [place] [address] Phone: [phone] Fax : [phone]  Primary Care Physician:  Larae Grooms, NP Primary Gastroenterologist:  Dr. Servando Snare  Pre-Procedure History & Physical: HPI:  Shellie Rogoff is a 67 y.o. female is here for a screening colonoscopy.   Past Medical History:  Diagnosis Date   Advanced care planning/counseling discussion 09/11/2022   Allergy    Anxiety    Asthma    Cataract    GERD (gastroesophageal reflux disease)     Past Surgical History:  Procedure Laterality Date   EYE SURGERY     THYROID LOBECTOMY  1996   TUBAL LIGATION      Prior to Admission medications   Medication Sig Start Date End Date Taking? Authorizing Provider  Cholecalciferol (VITAMIN D3) 10 MCG (400 UNIT) tablet Take by mouth.   Yes [provider]  citalopram (CELEXA) 10 MG tablet Take 1 tablet (10 mg total) by mouth daily. 09/11/22  Yes Larae Grooms, NP  montelukast (SINGULAIR) 10 MG tablet TAKE 1 TABLET BY MOUTH EVERYDAY AT BEDTIME 04/09/22  Yes Larae Grooms, NP  albuterol (VENTOLIN HFA) 108 (90 Base) MCG/ACT inhaler INHALE 1-2 PUFFS BY MOUTH EVERY 6 HOURS AS NEEDED FOR WHEEZE OR SHORTNESS OF BREATH 08/22/22   Larae Grooms, NP  betamethasone valerate ointment (VALISONE) 0.1 % Apply 1 Application topically daily. Small amount to both eyelids, avoid getting directly into eye.  Stop in 10 days. Patient not taking: Reported on 10/03/2022 04/24/22   Aura Dials T, NP    Allergies as of 09/11/2022 - Review Complete 09/11/2022  Allergen Reaction Noted   Penicillins Rash 11/06/2019   Sulfa antibiotics Rash 11/06/2019    History reviewed. No pertinent family history.  Social History   Socioeconomic History   Marital status: Unknown    Spouse name: Not on file   Number of children: Not on file   Years of education: Not on file   Highest education level: Not on file  Occupational History   Not on file   Tobacco Use   Smoking status: Never   Smokeless tobacco: Never  Vaping Use   Vaping Use: Never used  Substance and Sexual Activity   Alcohol use: Yes    Alcohol/week: 15.0 standard drinks of alcohol    Types: 5 Glasses of wine, 10 Cans of beer per week   Drug use: Not Currently   Sexual activity: Yes  Other Topics Concern   Not on file  Social History Narrative   Not on file   Social Determinants of Health   Financial Resource Strain: Not on file  Food Insecurity: Not on file  Transportation Needs: Not on file  Physical Activity: Not on file  Stress: Not on file  Social Connections: Not on file  Intimate Partner Violence: Not on file    Review of Systems: See HPI, otherwise negative ROS  Physical Exam: BP 126/71   Pulse 96   Temp (!) 96.2 F (35.7 C) (Temporal)   Resp 18   Ht 5\' 7"  (1.702 m)   Wt 64.5 kg   SpO2 99%   BMI 22.28 kg/m  General:   Alert,  pleasant and cooperative in NAD Head:  Normocephalic and atraumatic. Neck:  Supple; no masses or thyromegaly. Lungs:  Clear throughout to auscultation.    Heart:  Regular rate and rhythm. Abdomen:  Soft, nontender and nondistended. Normal bowel sounds, without guarding, and without rebound.   Neurologic:  Alert and  oriented x4;  grossly normal neurologically.  Impression/Plan: [facility] is now here to undergo a screening colonoscopy.  Risks, benefits, and alternatives regarding colonoscopy have been reviewed with the patient.  Questions have been answered.  All parties agreeable.

## 2022-10-03 NOTE — Op Note (Signed)
[facility] Gastroenterology Patient Name: [facility] Procedure Date: 10/03/2022 7:14 AM MRN: [id number] Account #: [id number] Date of Birth: [birth year] Admit Type: Outpatient Age: 67 Room: [facility] ENDO ROOM 4 Gender: Female Note Status: Finalized Instrument Name: Prentice Docker [id number] Procedure:             Colonoscopy Indications:           Screening for colorectal malignant neoplasm Providers:             Midge Minium MD, MD Referring MD:          Larae Grooms (Referring MD) Medicines:             Propofol per Anesthesia Complications:         No immediate complications. Procedure:             Pre-Anesthesia Assessment:                        - Prior to the procedure, a History and Physical was                         performed, and patient medications and allergies were                         reviewed. The patient's tolerance of previous                         anesthesia was also reviewed. The risks and benefits                         of the procedure and the sedation options and risks                         were discussed with the patient. All questions were                         answered, and informed consent was obtained. Prior                         Anticoagulants: The patient has taken no anticoagulant                         or antiplatelet agents. ASA Grade Assessment: II - A                         patient with mild systemic disease. After reviewing                         the risks and benefits, the patient was deemed in                         satisfactory condition to undergo the procedure.                        After obtaining informed consent, the colonoscope was                         passed under direct vision. Throughout the procedure,  the patient's blood pressure, pulse, and oxygen                         saturations were monitored continuously. The                         Colonoscope was introduced through the  anus and                         advanced to the the cecum, identified by appendiceal                         orifice and ileocecal valve. The colonoscopy was                         performed without difficulty. The patient tolerated                         the procedure well. The quality of the bowel                         preparation was excellent. Findings:      The perianal and digital rectal examinations were normal.      Multiple small-mouthed diverticula were found in the sigmoid colon.      A 5 mm polyp was found in the sigmoid colon. The polyp was sessile. The       polyp was removed with a cold snare. Resection and retrieval were       complete.      Non-bleeding internal hemorrhoids were found during retroflexion. The       hemorrhoids were Grade II (internal hemorrhoids that prolapse but reduce       spontaneously). Impression:            - Diverticulosis in the sigmoid colon.                        - One 5 mm polyp in the sigmoid colon, removed with a                         cold snare. Resected and retrieved.                        - Non-bleeding internal hemorrhoids. Recommendation:        - Discharge patient to home.                        - Resume previous diet.                        - Continue present medications.                        - Await pathology results.                        - If the pathology report reveals adenomatous tissue,                         then repeat the colonoscopy for surveillance in 7  years. Procedure Code(s):     --- Professional ---                        [phone], Colonoscopy, flexible; with removal of                         tumor(s), polyp(s), or other lesion(s) by snare                         technique Diagnosis Code(s):     --- Professional ---                        Z12.11, Encounter for screening for malignant neoplasm                         of colon                        D12.5, Benign neoplasm of  sigmoid colon CPT copyright 2022 American Medical Association. All rights reserved. The codes documented in this report are preliminary and upon coder review may  be revised to meet current compliance requirements. Midge Minium MD, MD 10/03/2022 7:47:55 AM This report has been signed electronically. Number of Addenda: 0 Note Initiated On: 10/03/2022 7:14 AM Scope Withdrawal Time: 0 hours 8 minutes 15 seconds  Total Procedure Duration: 0 hours 15 minutes 33 seconds  Estimated Blood Loss:  Estimated blood loss: none.      [facility]

## 2022-10-04 ENCOUNTER — Encounter: Payer: Self-pay | Admitting: Gastroenterology

## 2022-10-18 DIAGNOSIS — L570 Actinic keratosis: Secondary | ICD-10-CM | POA: Diagnosis not present

## 2022-10-18 DIAGNOSIS — D2272 Melanocytic nevi of left lower limb, including hip: Secondary | ICD-10-CM | POA: Diagnosis not present

## 2022-10-18 DIAGNOSIS — D225 Melanocytic nevi of trunk: Secondary | ICD-10-CM | POA: Diagnosis not present

## 2022-10-18 DIAGNOSIS — L538 Other specified erythematous conditions: Secondary | ICD-10-CM | POA: Diagnosis not present

## 2022-10-18 DIAGNOSIS — L820 Inflamed seborrheic keratosis: Secondary | ICD-10-CM | POA: Diagnosis not present

## 2022-10-18 DIAGNOSIS — D2261 Melanocytic nevi of right upper limb, including shoulder: Secondary | ICD-10-CM | POA: Diagnosis not present

## 2022-10-18 DIAGNOSIS — L578 Other skin changes due to chronic exposure to nonionizing radiation: Secondary | ICD-10-CM | POA: Diagnosis not present

## 2022-10-18 DIAGNOSIS — Z85828 Personal history of other malignant neoplasm of skin: Secondary | ICD-10-CM | POA: Diagnosis not present

## 2022-10-25 ENCOUNTER — Other Ambulatory Visit: Payer: Self-pay | Admitting: Nurse Practitioner

## 2022-10-25 NOTE — Telephone Encounter (Signed)
Requested Prescriptions  Pending Prescriptions Disp Refills   montelukast (SINGULAIR) 10 MG tablet [Pharmacy Med Name: MONTELUKAST SOD 10 MG TABLET] 90 tablet 0    Sig: TAKE 1 TABLET BY MOUTH EVERYDAY AT BEDTIME     Pulmonology:  Leukotriene Inhibitors Passed - 10/25/2022  2:38 AM      Passed - Valid encounter within last 12 months    Recent Outpatient Visits           1 month ago Welcome to [employer] preventive visit   [place] [facility] Larae Grooms, NP   6 months ago Eyelid abnormality   [place] [facility] [place], Corrie Dandy T, NP   7 months ago Anxiety   [place] [facility] Larae Grooms, NP   1 year ago Annual physical exam   [place] [facility] Larae Grooms, NP   1 year ago Encounter to establish care   [facility] Larae Grooms, NP       Future Appointments             In 4 months Larae Grooms, NP  [facility]

## 2022-11-13 DIAGNOSIS — M9901 Segmental and somatic dysfunction of cervical region: Secondary | ICD-10-CM | POA: Diagnosis not present

## 2022-11-13 DIAGNOSIS — M542 Cervicalgia: Secondary | ICD-10-CM | POA: Diagnosis not present

## 2022-11-13 DIAGNOSIS — M9903 Segmental and somatic dysfunction of lumbar region: Secondary | ICD-10-CM | POA: Diagnosis not present

## 2022-11-13 DIAGNOSIS — M5416 Radiculopathy, lumbar region: Secondary | ICD-10-CM | POA: Diagnosis not present

## 2022-12-18 ENCOUNTER — Ambulatory Visit
Admission: RE | Admit: 2022-12-18 | Discharge: 2022-12-18 | Disposition: A | Discharge status: Home / Self Care | Payer: Medicare HMO | Source: Ambulatory Visit | Attending: Nurse Practitioner | Admitting: Nurse Practitioner

## 2022-12-18 DIAGNOSIS — M85852 Other specified disorders of bone density and structure, left thigh: Secondary | ICD-10-CM | POA: Diagnosis not present

## 2022-12-18 DIAGNOSIS — Z780 Asymptomatic menopausal state: Secondary | ICD-10-CM | POA: Diagnosis not present

## 2022-12-18 NOTE — Progress Notes (Signed)
The results of your bone mineral testing revealed that you are osteopenic. This means that your bones are not as strong as they used to be and can lead to osteoporosis and and increased risk of fractures I recommend supplementation with Calcium and Vitamin D to assist with preventing progression to osteoporosis and to help improve bone health. You should strive to get 1000-1200 mg of Calcium per day (supplementing and Calcium found in your regular diet should total this amount per day) You should try to get [phone] IU of Vitamin D per day (supplementing with Vitamin D2 or Vitamin D3 in addition to dietary sources should total this amount per day)  Weight bearing exercises and strength training can also help improve bone health.

## 2023-01-08 DIAGNOSIS — M5416 Radiculopathy, lumbar region: Secondary | ICD-10-CM | POA: Diagnosis not present

## 2023-01-08 DIAGNOSIS — M9901 Segmental and somatic dysfunction of cervical region: Secondary | ICD-10-CM | POA: Diagnosis not present

## 2023-01-08 DIAGNOSIS — M9903 Segmental and somatic dysfunction of lumbar region: Secondary | ICD-10-CM | POA: Diagnosis not present

## 2023-01-08 DIAGNOSIS — M542 Cervicalgia: Secondary | ICD-10-CM | POA: Diagnosis not present

## 2023-01-22 DIAGNOSIS — R69 Illness, unspecified: Secondary | ICD-10-CM | POA: Diagnosis not present

## 2023-01-25 ENCOUNTER — Other Ambulatory Visit: Payer: Self-pay | Admitting: Nurse Practitioner

## 2023-01-25 NOTE — Telephone Encounter (Signed)
Requested Prescriptions  Pending Prescriptions Disp Refills   montelukast (SINGULAIR) 10 MG tablet [Pharmacy Med Name: MONTELUKAST SOD 10 MG TABLET] 90 tablet 0    Sig: TAKE 1 TABLET BY MOUTH EVERYDAY AT BEDTIME     Pulmonology:  Leukotriene Inhibitors Passed - 01/25/2023  1:39 AM      Passed - Valid encounter within last 12 months    Recent Outpatient Visits           4 months ago Welcome to [employer] preventive visit   [place] [facility] Larae Grooms, NP   9 months ago Eyelid abnormality   [place] [facility] [place], Corrie Dandy T, NP   10 months ago Anxiety   [facility] Larae Grooms, NP   1 year ago Annual physical exam   [place] [facility] Larae Grooms, NP   1 year ago Encounter to establish care   [place] [facility] Larae Grooms, NP       Future Appointments             In 1 month Larae Grooms, NP  [facility]

## 2023-03-04 DIAGNOSIS — R69 Illness, unspecified: Secondary | ICD-10-CM | POA: Diagnosis not present

## 2023-03-08 DIAGNOSIS — R69 Illness, unspecified: Secondary | ICD-10-CM | POA: Diagnosis not present

## 2023-03-12 NOTE — Progress Notes (Unsigned)
There were no vitals taken for this visit.   Subjective:    Patient ID: [name], female    DOB: [birth year], 67 y.o.   MRN: [id number]  HPI: Krystal Elliott is a 67 y.o. female  No chief complaint on file.  MOOD Patient states she has been doing well.  Feels like the Celexa is working well for her.  Denies concerns regarding her medication.  Denies SI.    HYPERLIPIDEMIA Hyperlipidemia status: excellent compliance Satisfied with current treatment?  yes Side effects:  no Medication compliance: excellent compliance Past cholesterol meds: none Supplements: none Aspirin:  no The 10-year ASCVD risk score (Arnett DK, et al., 2019) is: 3.9%   Values used to calculate the score:     Age: 72 years     Sex: Female     Is Non-Hispanic African American: No     Diabetic: No     Tobacco smoker: No     Systolic Blood Pressure: 113 mmHg     Is BP treated: No     HDL Cholesterol: 83 mg/dL     Total Cholesterol: 224 mg/dL Chest pain:  no Coronary artery disease:  no Family history CAD:  no Family history early CAD:  no  Relevant past medical, surgical, family and social history reviewed and updated as indicated. Interim medical history since our last visit reviewed. Allergies and medications reviewed and updated.  Review of Systems  Per HPI unless specifically indicated above     Objective:    There were no vitals taken for this visit.  Wt Readings from Last 3 Encounters:  10/03/22 142 lb 4.6 oz (64.5 kg)  09/11/22 145 lb 12.8 oz (66.1 kg)  04/24/22 147 lb 3.2 oz (66.8 kg)    Physical Exam  Results for orders placed or performed in visit on 09/11/22  Microscopic Examination   Collection Time: 09/11/22  8:37 AM   Urine  Result Value Ref Range   WBC, UA 0-5 0 - 5 /hpf   RBC, Urine 3-10 (A) 0 - 2 /hpf   Epithelial Cells (non renal) 0-10 0 - 10 /hpf   Mucus, UA Present (A) Not Estab.   Bacteria, UA None seen None seen/Few  Urinalysis, Routine w reflex microscopic    Collection Time: 09/11/22  8:37 AM  Result Value Ref Range   Specific Gravity, UA 1.010 1.005 - 1.030   pH, UA 6.0 5.0 - 7.5   Color, UA Yellow Yellow   Appearance Ur Clear Clear   Leukocytes,UA Negative Negative   Protein,UA Negative Negative/Trace   Glucose, UA Negative Negative   Ketones, UA Negative Negative   RBC, UA 1+ (A) Negative   Bilirubin, UA Negative Negative   Urobilinogen, Ur 0.2 0.2 - 1.0 mg/dL   Nitrite, UA Negative Negative   Microscopic Examination See below:   CBC with Differential/Platelet   Collection Time: 09/11/22  8:44 AM  Result Value Ref Range   WBC 6.3 3.4 - 10.8 x10E3/uL   RBC 4.09 3.77 - 5.28 x10E6/uL   Hemoglobin 12.9 11.1 - 15.9 g/dL   Hematocrit 13.2 44.0 - 46.6 %   MCV 93 79 - 97 fL   MCH 31.5 26.6 - 33.0 pg   MCHC 33.9 31.5 - 35.7 g/dL   RDW 10.2 72.5 - 36.6 %   Platelets 270 150 - 450 x10E3/uL   Neutrophils 50 Not Estab. %   Lymphs 33 Not Estab. %   Monocytes 11 Not Estab. %   Eos 5  Not Estab. %   Basos 1 Not Estab. %   Neutrophils Absolute 3.2 1.4 - 7.0 x10E3/uL   Lymphocytes Absolute 2.1 0.7 - 3.1 x10E3/uL   Monocytes Absolute 0.7 0.1 - 0.9 x10E3/uL   EOS (ABSOLUTE) 0.3 0.0 - 0.4 x10E3/uL   Basophils Absolute 0.1 0.0 - 0.2 x10E3/uL   Immature Granulocytes 0 Not Estab. %   Immature Grans (Abs) 0.0 0.0 - 0.1 x10E3/uL  Comprehensive metabolic panel   Collection Time: 09/11/22  8:44 AM  Result Value Ref Range   Glucose 82 70 - 99 mg/dL   BUN 17 8 - 27 mg/dL   Creatinine, Ser 4.03 0.57 - 1.00 mg/dL   eGFR 69 >47 QQ/VZD/6.38   BUN/Creatinine Ratio 18 12 - 28   Sodium 138 134 - 144 mmol/L   Potassium 4.0 3.5 - 5.2 mmol/L   Chloride 101 96 - 106 mmol/L   CO2 23 20 - 29 mmol/L   Calcium 9.2 8.7 - 10.3 mg/dL   Total Protein 6.6 6.0 - 8.5 g/dL   Albumin 4.7 3.9 - 4.9 g/dL   Globulin, Total 1.9 1.5 - 4.5 g/dL   Bilirubin Total 0.6 0.0 - 1.2 mg/dL   Alkaline Phosphatase 69 44 - 121 IU/L   AST 18 0 - 40 IU/L   ALT 15 0 - 32 IU/L  Lipid  panel   Collection Time: 09/11/22  8:44 AM  Result Value Ref Range   Cholesterol, Total 234 (H) 100 - 199 mg/dL   Triglycerides 91 0 - 149 mg/dL   HDL 756 >43 mg/dL   VLDL Cholesterol Cal 15 5 - 40 mg/dL   LDL Chol Calc (NIH) 329 (H) 0 - 99 mg/dL   Chol/HDL Ratio 2.3 0.0 - 4.4 ratio  TSH   Collection Time: 09/11/22  8:44 AM  Result Value Ref Range   TSH 1.360 0.450 - 4.500 uIU/mL      Assessment & Plan:   Problem List Items Addressed This Visit   None    Follow up plan: No follow-ups on file.

## 2023-03-13 ENCOUNTER — Ambulatory Visit (INDEPENDENT_AMBULATORY_CARE_PROVIDER_SITE_OTHER): Payer: Medicare HMO | Admitting: Nurse Practitioner

## 2023-03-13 ENCOUNTER — Encounter: Payer: Self-pay | Admitting: Nurse Practitioner

## 2023-03-13 VITALS — BP 127/72 | HR 75 | Temp 97.7°F | Ht 67.5 in | Wt 147.0 lb

## 2023-03-13 DIAGNOSIS — F339 Major depressive disorder, recurrent, unspecified: Secondary | ICD-10-CM | POA: Diagnosis not present

## 2023-03-13 DIAGNOSIS — E7800 Pure hypercholesterolemia, unspecified: Secondary | ICD-10-CM

## 2023-03-13 DIAGNOSIS — F419 Anxiety disorder, unspecified: Secondary | ICD-10-CM | POA: Diagnosis not present

## 2023-03-13 MED ORDER — CITALOPRAM HYDROBROMIDE 10 MG PO TABS: 10.0000 mg | ORAL_TABLET | Freq: Every day | ORAL | 1 refills | Status: DC

## 2023-03-13 MED ORDER — MONTELUKAST SODIUM 10 MG PO TABS: 10.0000 mg | ORAL_TABLET | Freq: Every day | ORAL | 1 refills | Status: DC

## 2023-03-13 NOTE — Assessment & Plan Note (Signed)
Chronic.  Controlled.  Continue with current medication regimen.  Labs ordered today.  Return to clinic in 6 months for reevaluation.  Call sooner if concerns arise.  ? ?

## 2023-03-13 NOTE — Assessment & Plan Note (Signed)
Chronic.  Controlled.  Continue with current medication regimen of Celexa 10mg.  Refills sent today.  Labs ordered today.  Return to clinic in 6 months for reevaluation.  Call sooner if concerns arise.   

## 2023-03-14 LAB — COMPREHENSIVE METABOLIC PANEL
ALT: 15 [IU]/L (ref 0–32)
AST: 19 [IU]/L (ref 0–40)
Albumin: 4.6 g/dL (ref 3.9–4.9)
Alkaline Phosphatase: 66 [IU]/L (ref 44–121)
BUN/Creatinine Ratio: 18 (ref 12–28)
BUN: 16 mg/dL (ref 8–27)
Bilirubin Total: 0.6 mg/dL (ref 0.0–1.2)
CO2: 25 mmol/L (ref 20–29)
Calcium: 9.3 mg/dL (ref 8.7–10.3)
Chloride: 102 mmol/L (ref 96–106)
Creatinine, Ser: 0.87 mg/dL (ref 0.57–1.00)
Globulin, Total: 2.4 g/dL (ref 1.5–4.5)
Glucose: 88 mg/dL (ref 70–99)
Potassium: 4.6 mmol/L (ref 3.5–5.2)
Sodium: 139 mmol/L (ref 134–144)
Total Protein: 7 g/dL (ref 6.0–8.5)
eGFR: 74 mL/min/{1.73_m2} (ref >59)

## 2023-03-14 LAB — LIPID PANEL
Chol/HDL Ratio: 2.5 {ratio} (ref 0.0–4.4)
Cholesterol, Total: 237 mg/dL — ABNORMAL HIGH (ref 100–199)
HDL: 95 mg/dL (ref >39)
LDL Chol Calc (NIH): 132 mg/dL — ABNORMAL HIGH (ref 0–99)
Triglycerides: 60 mg/dL (ref 0–149)
VLDL Cholesterol Cal: 10 mg/dL (ref 5–40)

## 2023-04-02 DIAGNOSIS — M9903 Segmental and somatic dysfunction of lumbar region: Secondary | ICD-10-CM | POA: Diagnosis not present

## 2023-04-02 DIAGNOSIS — M9901 Segmental and somatic dysfunction of cervical region: Secondary | ICD-10-CM | POA: Diagnosis not present

## 2023-04-02 DIAGNOSIS — M542 Cervicalgia: Secondary | ICD-10-CM | POA: Diagnosis not present

## 2023-04-02 DIAGNOSIS — M5416 Radiculopathy, lumbar region: Secondary | ICD-10-CM | POA: Diagnosis not present

## 2023-04-25 ENCOUNTER — Other Ambulatory Visit: Payer: Self-pay | Admitting: Nurse Practitioner

## 2023-04-26 NOTE — Telephone Encounter (Signed)
Request lasted refilled 03/13/23 for 90 and 1 refill.  Requested Prescriptions  Pending Prescriptions Disp Refills   montelukast (SINGULAIR) 10 MG tablet [Pharmacy Med Name: MONTELUKAST SOD 10 MG TABLET] 90 tablet 1    Sig: TAKE 1 TABLET BY MOUTH EVERYDAY AT BEDTIME     Pulmonology:  Leukotriene Inhibitors Passed - 04/26/2023  8:56 AM      Passed - Valid encounter within last 12 months    Recent Outpatient Visits           1 month ago Anxiety   [place] Larae Grooms, NP   7 months ago Welcome to [employer] preventive visit   [place] [facility] Larae Grooms, NP   1 year ago Eyelid abnormality   [place] [facility] Marjie Skiff, NP   1 year ago Anxiety   [place] [facility] Larae Grooms, NP   1 year ago Annual physical exam   [place] [facility] Larae Grooms, NP       Future Appointments             In 4 months Larae Grooms, NP [facility]

## 2023-05-15 ENCOUNTER — Telehealth: Payer: Self-pay | Admitting: Nurse Practitioner

## 2023-05-15 NOTE — Telephone Encounter (Signed)
 Copied from CRM [id number]. Topic: General - Other >> May 15, 2023 10:50 AM Franchot Heidelberg wrote: Reason for CRM: Pt called to report that she received a letter stating that her PCP has not updated her credentialing with her insurance. "Network credentialing has not been updated with [employer] since 04/28/2023"

## 2023-06-19 DIAGNOSIS — M9901 Segmental and somatic dysfunction of cervical region: Secondary | ICD-10-CM | POA: Diagnosis not present

## 2023-06-19 DIAGNOSIS — M5416 Radiculopathy, lumbar region: Secondary | ICD-10-CM | POA: Diagnosis not present

## 2023-06-19 DIAGNOSIS — M9903 Segmental and somatic dysfunction of lumbar region: Secondary | ICD-10-CM | POA: Diagnosis not present

## 2023-06-19 DIAGNOSIS — M542 Cervicalgia: Secondary | ICD-10-CM | POA: Diagnosis not present

## 2023-07-04 ENCOUNTER — Telehealth: Payer: Self-pay | Admitting: Nurse Practitioner

## 2023-07-04 IMAGING — US US THYROID
1 series · 14 of 25 positions shown · non-contrast
Comparison: None Available.

CLINICAL DATA: Remote left thyroidectomy

EXAM:
THYROID ULTRASOUND
TECHNIQUE: Ultrasound examination of the thyroid gland and adjacent soft
tissues was performed.

[Series 1: us thyroid · 14 of 25 slices shown]
[im 1/25]
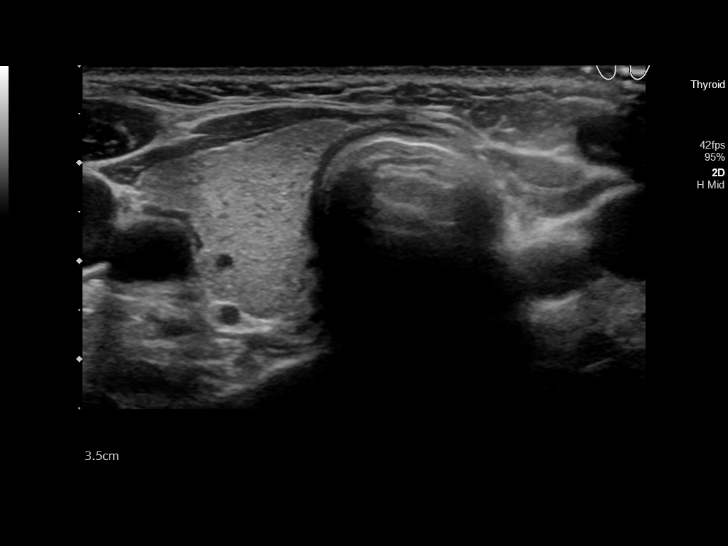
[im 3/25]
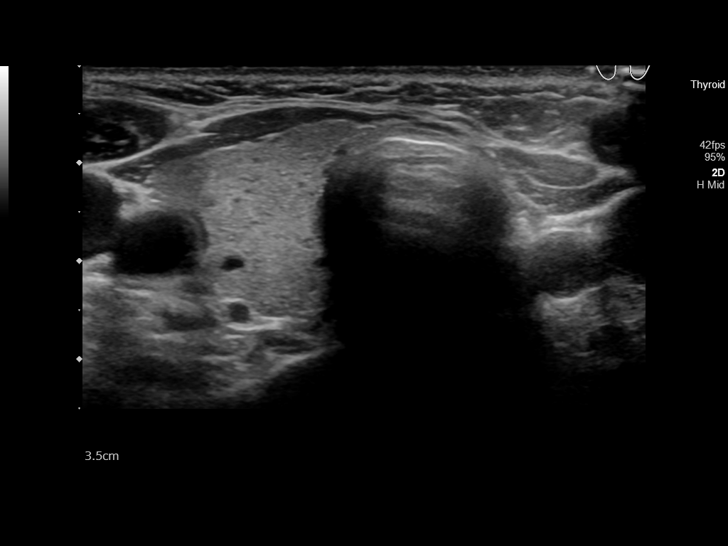
[im 5/25]
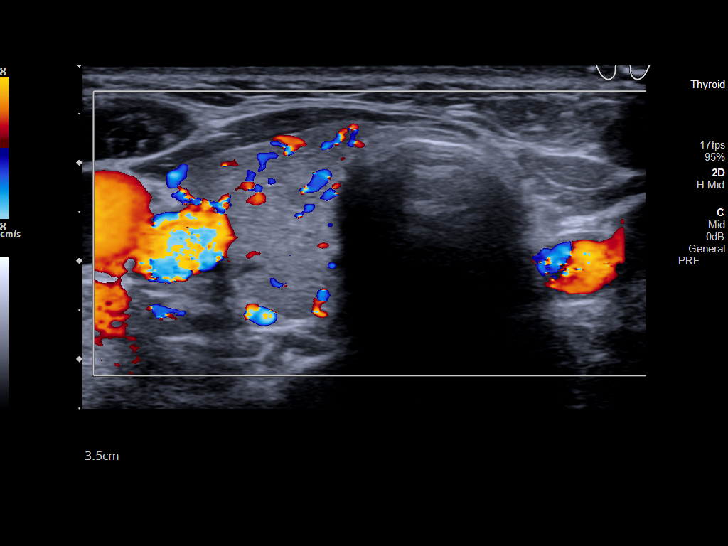
[im 7/25]
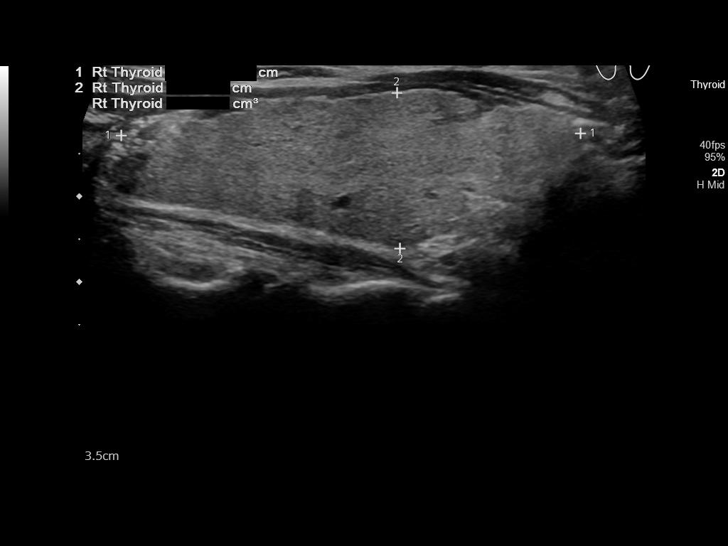
[im 9/25]
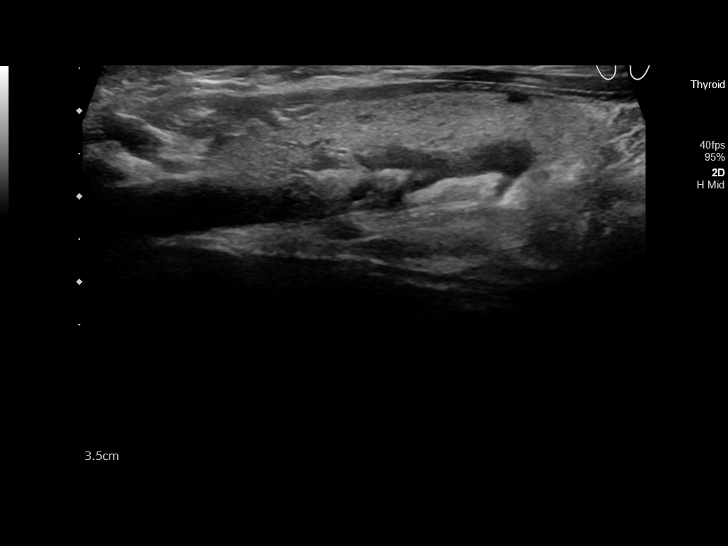
[im 10/25]
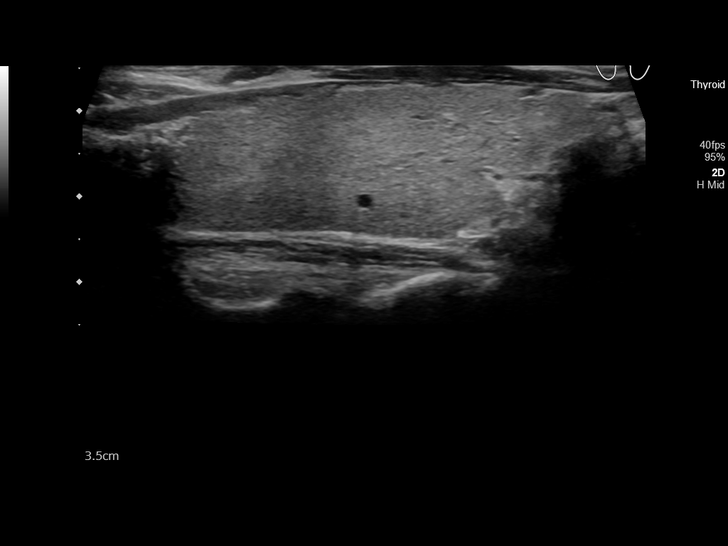
[im 12/25]
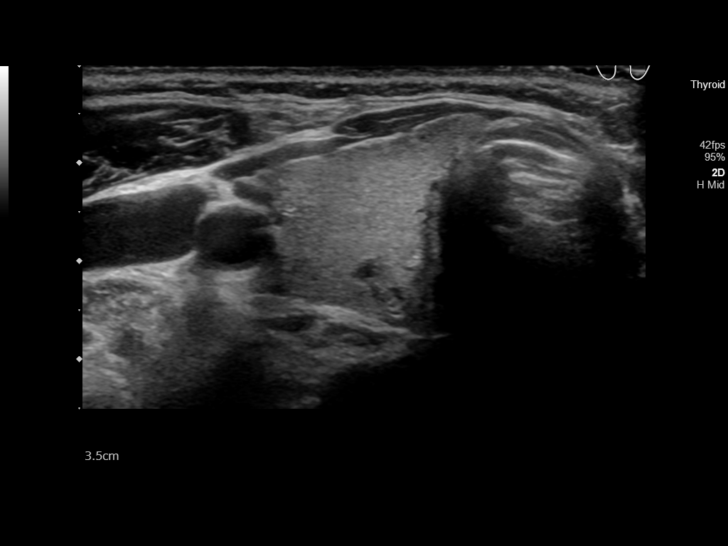
[im 14/25]
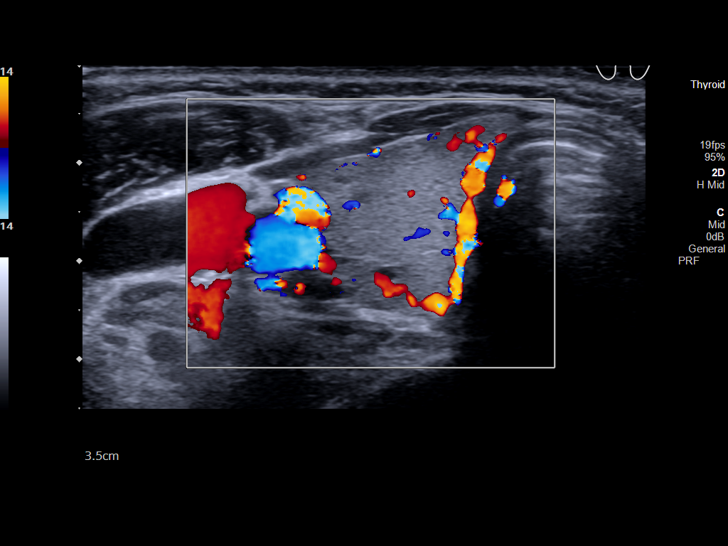
[im 16/25]
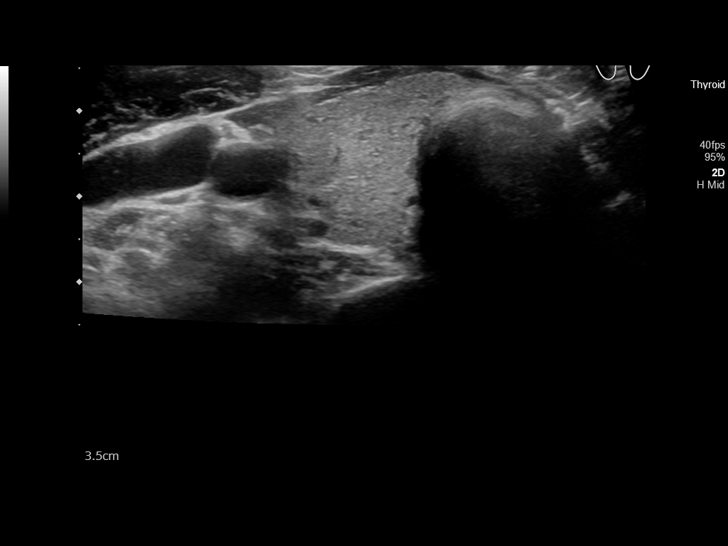
[im 17/25]
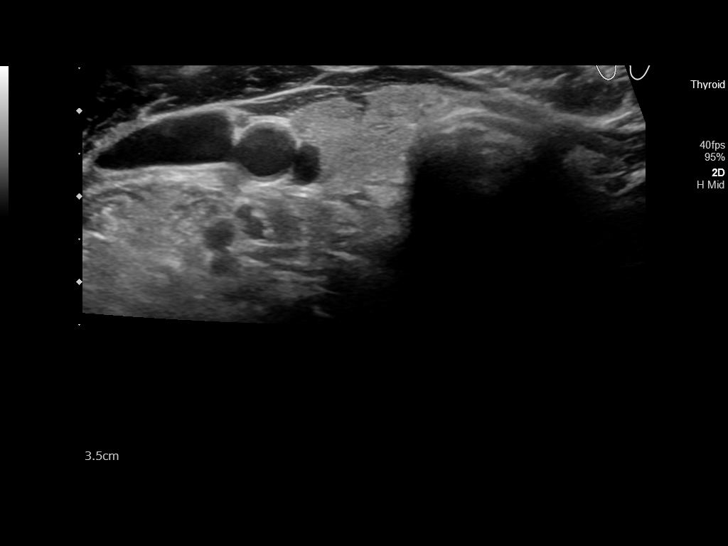
[im 19/25]
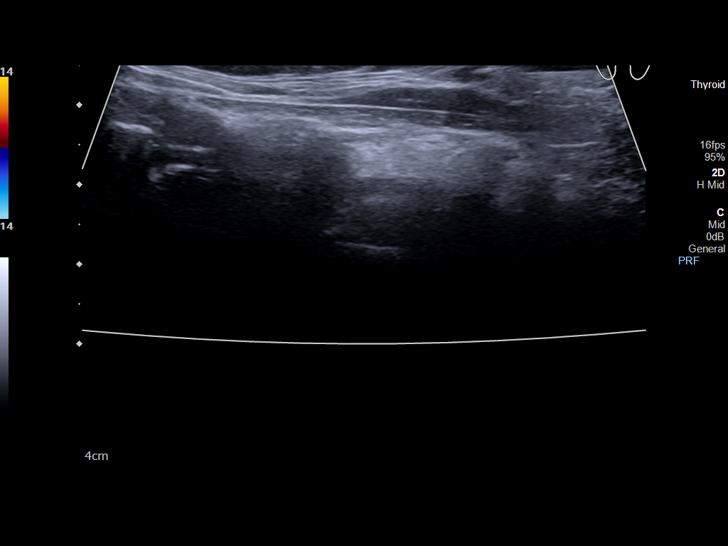
[im 21/25]
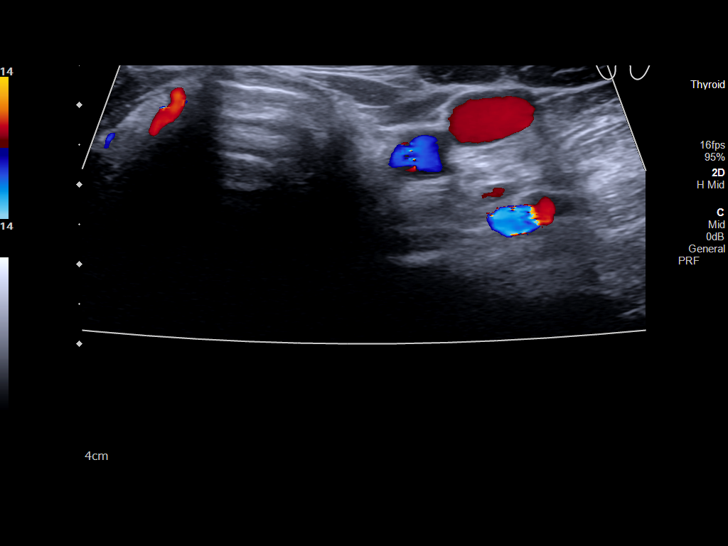
[im 23/25]
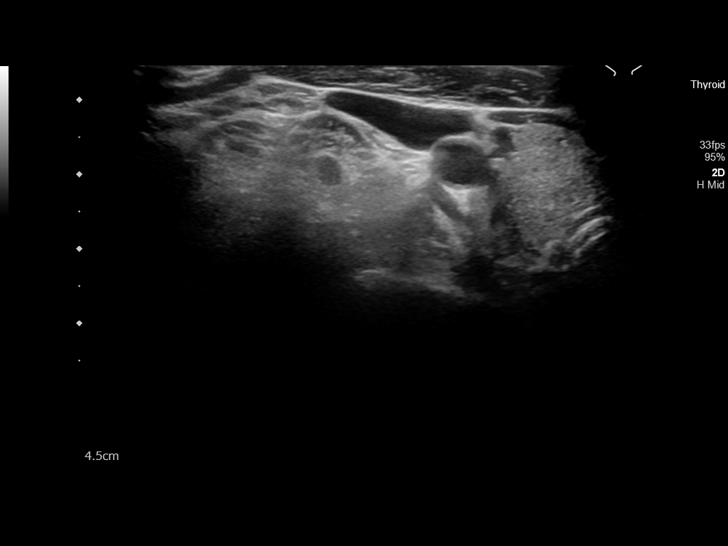
[im 25/25]
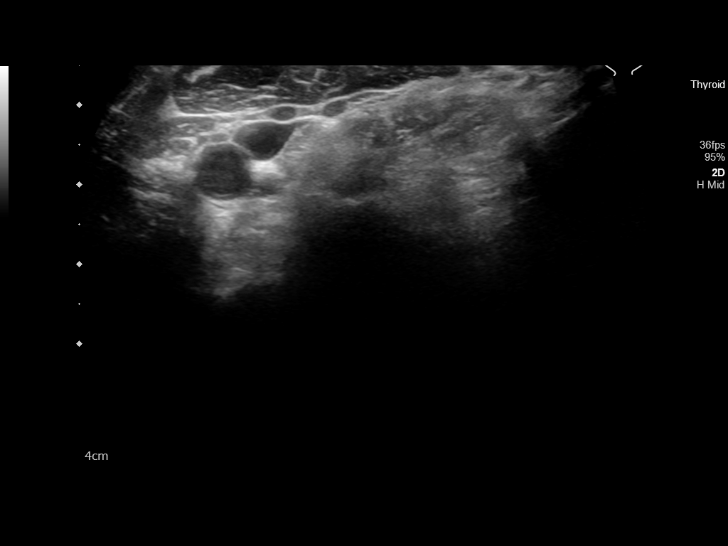

[14 of 25 positions shown; findings below may reference images not displayed]

FINDINGS: Parenchymal Echotexture: Mildly heterogenous

Isthmus: 2 mm

Right lobe: 5.4 x 1.8 x 1.7 cm

Left lobe: Surgically absent

_________________________________________________________

Estimated total number of nodules >/= 1 cm: 0

Number of spongiform nodules >/=  2 cm not described below (TR1): 0

Number of mixed cystic and solid nodules >/= 1.5 cm not described
below (TR2): 0

_________________________________________________________

Nonspecific minor thyroid heterogeneity. Normal vascularity.
Negative for nodule. Remote left thyroidectomy. No left thyroid bed
abnormality. No regional adenopathy.
IMPRESSION: Remote left thyroidectomy. No other significant finding by
ultrasound.

The above is in keeping with the ACR TI-RADS recommendations - [HOSPITAL] 4067;[DATE].

## 2023-07-04 NOTE — Telephone Encounter (Signed)
 Copied from CRM [id number]. Topic: Medicare AWV >> Jul 04, 2023  2:53 PM Payton Doughty wrote: Reason for CRM: Called LVM 07/04/2023 to schedule AWV. Please schedule Virtual or Telehealth visits ONLY.   Verlee Rossetti; Care Guide Ambulatory Clinical Support [place] [facility] Direct Dial: [phone]

## 2023-07-05 ENCOUNTER — Telehealth: Payer: Self-pay | Admitting: Nurse Practitioner

## 2023-07-05 NOTE — Telephone Encounter (Signed)
 Copied from CRM [id number]. Topic: Medicare AWV >> Jul 05, 2023  9:38 AM Payton Doughty wrote: Reason for CRM: Called LVM 07/05/2023 to schedule AWV. Please schedule Virtual or Telehealth visits ONLY.   Verlee Rossetti; Care Guide Ambulatory Clinical Support [place] [facility] Direct Dial: [phone]

## 2023-07-09 DIAGNOSIS — D2261 Melanocytic nevi of right upper limb, including shoulder: Secondary | ICD-10-CM | POA: Diagnosis not present

## 2023-07-09 DIAGNOSIS — L570 Actinic keratosis: Secondary | ICD-10-CM | POA: Diagnosis not present

## 2023-07-09 DIAGNOSIS — D2272 Melanocytic nevi of left lower limb, including hip: Secondary | ICD-10-CM | POA: Diagnosis not present

## 2023-07-09 DIAGNOSIS — D225 Melanocytic nevi of trunk: Secondary | ICD-10-CM | POA: Diagnosis not present

## 2023-07-09 DIAGNOSIS — D2262 Melanocytic nevi of left upper limb, including shoulder: Secondary | ICD-10-CM | POA: Diagnosis not present

## 2023-07-31 DIAGNOSIS — M542 Cervicalgia: Secondary | ICD-10-CM | POA: Diagnosis not present

## 2023-07-31 DIAGNOSIS — M5416 Radiculopathy, lumbar region: Secondary | ICD-10-CM | POA: Diagnosis not present

## 2023-07-31 DIAGNOSIS — M9903 Segmental and somatic dysfunction of lumbar region: Secondary | ICD-10-CM | POA: Diagnosis not present

## 2023-07-31 DIAGNOSIS — M9901 Segmental and somatic dysfunction of cervical region: Secondary | ICD-10-CM | POA: Diagnosis not present

## 2023-08-05 DIAGNOSIS — H5203 Hypermetropia, bilateral: Secondary | ICD-10-CM | POA: Diagnosis not present

## 2023-08-08 DIAGNOSIS — K080 Exfoliation of teeth due to systemic causes: Secondary | ICD-10-CM | POA: Diagnosis not present

## 2023-09-17 ENCOUNTER — Encounter: Payer: Self-pay | Admitting: Nurse Practitioner

## 2023-09-17 ENCOUNTER — Ambulatory Visit (INDEPENDENT_AMBULATORY_CARE_PROVIDER_SITE_OTHER): Payer: Self-pay | Admitting: Nurse Practitioner

## 2023-09-17 VITALS — BP 132/81 | HR 77 | Ht 67.5 in | Wt 143.0 lb

## 2023-09-17 DIAGNOSIS — F419 Anxiety disorder, unspecified: Secondary | ICD-10-CM

## 2023-09-17 DIAGNOSIS — Z0000 Encounter for general adult medical examination without abnormal findings: Secondary | ICD-10-CM | POA: Diagnosis not present

## 2023-09-17 DIAGNOSIS — E7800 Pure hypercholesterolemia, unspecified: Secondary | ICD-10-CM | POA: Diagnosis not present

## 2023-09-17 DIAGNOSIS — F339 Major depressive disorder, recurrent, unspecified: Secondary | ICD-10-CM | POA: Diagnosis not present

## 2023-09-17 DIAGNOSIS — Z7189 Other specified counseling: Secondary | ICD-10-CM

## 2023-09-17 DIAGNOSIS — Z1231 Encounter for screening mammogram for malignant neoplasm of breast: Secondary | ICD-10-CM

## 2023-09-17 MED ORDER — CITALOPRAM HYDROBROMIDE 10 MG PO TABS: 10.0000 mg | ORAL_TABLET | Freq: Every day | ORAL | 1 refills | Status: AC

## 2023-09-17 MED ORDER — MONTELUKAST SODIUM 10 MG PO TABS: 10.0000 mg | ORAL_TABLET | Freq: Every day | ORAL | 1 refills | Status: DC

## 2023-09-17 NOTE — Assessment & Plan Note (Signed)
Chronic.  Controlled.  Continue with current medication regimen of Celexa 10mg.  Refills sent today.  Labs ordered today.  Return to clinic in 6 months for reevaluation.  Call sooner if concerns arise.   

## 2023-09-17 NOTE — Assessment & Plan Note (Signed)
 Chronic.  Controlled.  Continue with current medication regimen.  Labs ordered today.  Return to clinic in 6 months for reevaluation.  Call sooner if concerns arise.  ? ?

## 2023-09-17 NOTE — Assessment & Plan Note (Signed)
 A voluntary discussion about advance care planning including the explanation and discussion of advance directives was extensively discussed  with the patient for 5 minutes with patient.   Explanation about the health care proxy and Living will was reviewed and packet with forms with explanation of how to fill them out was given.  Patient would designate her husband, Larnell.  During this discussion, the patient plans to fill out the paperwork required.  Patient was offered a separate Advance Care Planning visit for further assistance with forms.

## 2023-09-17 NOTE — Progress Notes (Signed)
 Subjective:   Krystal Elliott is a 68 y.o. female who presents for Medicare Annual (Subsequent) preventive examination.  Visit Complete: In person  Patient Medicare AWV questionnaire was completed by the patient on 09/17/2023; I have confirmed that all information answered by patient is correct and no changes since this date.        Objective:    There were no vitals filed for this visit. There is no height or weight on file to calculate BMI.     10/03/2022    6:59 AM  Advanced Directives  Does Patient Have a Medical Advance Directive? No    Current Medications (verified) Outpatient Encounter Medications as of 09/17/2023  Medication Sig   Calcium Carb-Cholecalciferol (CALCIUM 500 + D PO) Take by mouth daily.   Cholecalciferol (VITAMIN D3) 10 MCG (400 UNIT) tablet Take by mouth.   citalopram  (CELEXA ) 10 MG tablet Take 1 tablet (10 mg total) by mouth daily.   montelukast  (SINGULAIR ) 10 MG tablet Take 1 tablet (10 mg total) by mouth at bedtime.   No facility-administered encounter medications on file as of 09/17/2023.    Allergies (verified) Penicillins and Sulfa antibiotics   History: Past Medical History:  Diagnosis Date   Advanced care planning/counseling discussion 09/11/2022   Allergy    Anxiety    Asthma    Cataract    GERD (gastroesophageal reflux disease)    Past Surgical History:  Procedure Laterality Date   COLONOSCOPY WITH PROPOFOL  N/A 10/03/2022   Procedure: COLONOSCOPY WITH PROPOFOL ;  Surgeon: Jinny Carmine, MD;  Location: [facility] ENDOSCOPY;  Service: Endoscopy;  Laterality: N/A;   EYE SURGERY     POLYPECTOMY  10/03/2022   Procedure: POLYPECTOMY;  Surgeon: Jinny Carmine, MD;  Location: [facility] ENDOSCOPY;  Service: Endoscopy;;   THYROID  LOBECTOMY  1996   TUBAL LIGATION     No family history on file. Social History   Socioeconomic History   Marital status: Significant Other    Spouse name: Not on file   Number of children: Not on file   Years of education:  Not on file   Highest education level: 12th grade  Occupational History   Not on file  Tobacco Use   Smoking status: Never   Smokeless tobacco: Never  Vaping Use   Vaping status: Never Used  Substance and Sexual Activity   Alcohol use: Yes    Alcohol/week: 15.0 standard drinks of alcohol    Types: 5 Glasses of wine, 10 Cans of beer per week   Drug use: Not Currently   Sexual activity: Yes  Other Topics Concern   Not on file  Social History Narrative   Not on file   Social Drivers of Health   Financial Resource Strain: Low Risk  (09/13/2023)   Overall Financial Resource Strain (CARDIA)    Difficulty of Paying Living Expenses: Not hard at all  Food Insecurity: No Food Insecurity (09/13/2023)   Hunger Vital Sign    Worried About Running Out of Food in the Last Year: Never true    Ran Out of Food in the Last Year: Never true  Transportation Needs: No Transportation Needs (09/13/2023)   PRAPARE - Administrator, Civil Service (Medical): No    Lack of Transportation (Non-Medical): No  Physical Activity: Sufficiently Active (09/13/2023)   Exercise Vital Sign    Days of Exercise per Week: 4 days    Minutes of Exercise per Session: 70 min  Stress: No Stress Concern Present (09/13/2023)   [place]  [employer] - Occupational Stress Questionnaire    Feeling of Stress: Only a little  Social Connections: Moderately Isolated (09/13/2023)   Social Connection and Isolation Panel    Frequency of Communication with Friends and Family: More than three times a week    Frequency of Social Gatherings with Friends and Family: More than three times a week    Attends Religious Services: Never    Database administrator or Organizations: No    Attends Engineer, structural: Not on file    Marital Status: Married    Tobacco Counseling Counseling given: Not Answered   Clinical Intake:                        Activities of Daily Living     No  data to display          Patient Care Team: Melvin Pao, NP as PCP - General (Nurse Practitioner)  Indicate any recent Medical Services you may have received from other than [facility] providers in the past year (date may be approximate).     Assessment:   This is a routine wellness examination for Krystal Elliott.  Hearing/Vision screen No results found.   Goals Addressed   None    Depression Screen    03/13/2023    9:27 AM 09/11/2022    8:13 AM 04/24/2022   10:56 AM 03/12/2022    2:38 PM 08/16/2021    1:34 PM 07/13/2021    8:50 AM 07/13/2021    8:49 AM  PHQ 2/9 Scores  PHQ - 2 Score 0 0 0 0 0 0 0  PHQ- 9 Score 0 0 0 1 0 1 1    Fall Risk    09/11/2022    8:13 AM 04/24/2022   10:56 AM 03/12/2022    2:38 PM 08/16/2021    1:34 PM 07/13/2021    8:48 AM  Fall Risk   Falls in the past year? 0 0 0 0 0  Number falls in past yr: 0 0 0 0 0  Injury with Fall? 0 0 0 0 0  Risk for fall due to : No Fall Risks No Fall Risks No Fall Risks No Fall Risks History of fall(s)  Follow up Falls evaluation completed Falls evaluation completed Falls evaluation completed  Falls evaluation completed  Falls evaluation completed      Data saved with a previous flowsheet row definition    MEDICARE RISK AT HOME:     Cognitive Function:        09/11/2022    8:07 AM  6CIT Screen  What Year? 0 points  What month? 0 points  What time? 0 points  Count back from 20 0 points  Months in reverse 0 points  Repeat phrase 0 points  Total Score 0 points    Immunizations Immunization History  Administered Date(s) Administered   Moderna Sars-Covid-2 Vaccination 08/06/2018, 09/03/2019, 03/24/2020   Tdap 08/16/2021    TDAP status: Up to date  Flu Vaccine status: Up to date  Pneumococcal vaccine status: Due, Education has been provided regarding the importance of this vaccine. Advised may receive this vaccine at local pharmacy or Health Dept. Aware to provide a copy of the vaccination record if  obtained from local pharmacy or Health Dept. Verbalized acceptance and understanding.  Covid-19 vaccine status: Information provided on how to obtain vaccines.   Qualifies for Shingles Vaccine? Yes   Zostavax completed No   Shingrix Completed?:  No.    Education has been provided regarding the importance of this vaccine. Patient has been advised to call insurance company to determine out of pocket expense if they have not yet received this vaccine. Advised may also receive vaccine at local pharmacy or Health Dept. Verbalized acceptance and understanding.  Screening Tests Health Maintenance  Topic Date Due   Zoster Vaccines- Shingrix (1 of 2) Never done   Pneumococcal Vaccine: 50+ Years (1 of 1 - PCV) Never done   COVID-19 Vaccine (4 - 2024-25 season) 11/25/2022   Medicare Annual Wellness (AWV)  09/11/2023   INFLUENZA VACCINE  10/25/2023   MAMMOGRAM  05/01/2024   Colonoscopy  10/02/2029   DTaP/Tdap/Td (2 - Td or Tdap) 08/17/2031   DEXA SCAN  Completed   Hepatitis C Screening  Completed   Hepatitis B Vaccines  Aged Out   HPV VACCINES  Aged Out   Meningococcal B Vaccine  Aged Out    Health Maintenance  Health Maintenance Due  Topic Date Due   Zoster Vaccines- Shingrix (1 of 2) Never done   Pneumococcal Vaccine: 50+ Years (1 of 1 - PCV) Never done   COVID-19 Vaccine (4 - 2024-25 season) 11/25/2022   Medicare Annual Wellness (AWV)  09/11/2023    Colorectal cancer screening: Type of screening: Colonoscopy. Completed 10/03/2022. Repeat every 7 years  Mammogram status: Completed 05/01/2022. Repeat every year    Lung Cancer Screening: (Low Dose CT Chest recommended if Age 10-80 years, 20 pack-year currently smoking OR have quit w/in 15years.) does not qualify.   Additional Screening:  Hepatitis C Screening: does not qualify; Completed 08/16/2021  Vision Screening: Recommended annual ophthalmology exams for early detection of glaucoma and other disorders of the eye. Is the  patient up to date with their annual eye exam?  Yes  Who is the provider or what is the name of the office in which the patient attends annual eye exams? [facility]: Recommended annual dental exams for proper oral hygiene    Community Resource Referral / Chronic Care Management: CRR required this visit?  No   CCM required this visit?  No     Plan:     I have personally reviewed and noted the following in the patient's chart:   Medical and social history Use of alcohol, tobacco or illicit drugs  Current medications and supplements including opioid prescriptions. Patient is not currently taking opioid prescriptions. Functional ability and status Nutritional status Physical activity Advanced directives List of other physicians Hospitalizations, surgeries, and ER visits in previous 12 months Vitals Screenings to include cognitive, depression, and falls Referrals and appointments  In addition, I have reviewed and discussed with patient certain preventive protocols, quality metrics, and best practice recommendations. A written personalized care plan for preventive services as well as general preventive health recommendations were provided to patient.     Clotilda CHRISTELLA Eagles, CMA   09/17/2023   After Visit Summary: (In Person-Printed) AVS printed and given to the patient

## 2023-09-17 NOTE — Progress Notes (Signed)
 BP 132/81   Pulse 77   Ht 5' 7.5 (1.715 m)   Wt 143 lb (64.9 kg)   SpO2 95%   BMI 22.07 kg/m    Subjective:    Patient ID: [facility], female    DOB: 1955-08-20, 68 y.o.   MRN: [id number]  HPI: Krystal Elliott is a 68 y.o. female presenting on 09/17/2023 for comprehensive medical examination. Current medical complaints include:none  She currently lives with: Menopausal Symptoms: no  MOOD Patient states she has been doing well.  Feels like the Celexa  is working well for her.  Denies concerns regarding her medication.  Denies SI.   HYPERLIPIDEMIA Hyperlipidemia status: excellent compliance Satisfied with current treatment?  yes Side effects:  no Medication compliance: excellent compliance Past cholesterol meds: none Supplements: none Aspirin:  no The 10-year ASCVD risk score (Arnett DK, et al., 2019) is: 3.9%   Values used to calculate the score:     Age: 68 years     Sex: Female     Is Non-Hispanic African American: No     Diabetic: No     Tobacco smoker: No     Systolic Blood Pressure: 113 mmHg     Is BP treated: No     HDL Cholesterol: 83 mg/dL     Total Cholesterol: 224 mg/dL Chest pain:  no Coronary artery disease:  no Family history CAD:  no Family history early CAD:  no  Depression Screen done today and results listed below:     09/17/2023    8:52 AM 03/13/2023    9:27 AM 09/11/2022    8:13 AM 04/24/2022   10:56 AM 03/12/2022    2:38 PM  Depression screen PHQ 2/9  Decreased Interest 0 0 0 0 0  Down, Depressed, Hopeless 0 0 0 0 0  PHQ - 2 Score 0 0 0 0 0  Altered sleeping 0 0 0 0 0  Tired, decreased energy 0 0 0 0 1  Change in appetite 0 0 0 0 0  Feeling bad or failure about yourself  0 0 0 0 0  Trouble concentrating 0 0 0 0 0  Moving slowly or fidgety/restless 0 0 0 0 0  Suicidal thoughts 0 0 0 0 0  PHQ-9 Score 0 0 0 0 1  Difficult doing work/chores Not difficult at all  Not difficult at all Not difficult at all Not difficult at all    The  patient does not have a history of falls. I did complete a risk assessment for falls. A plan of care for falls was documented.   Past Medical History:  Past Medical History:  Diagnosis Date   Advanced care planning/counseling discussion 09/11/2022   Allergy    Anxiety    Asthma    Cataract    GERD (gastroesophageal reflux disease)     Surgical History:  Past Surgical History:  Procedure Laterality Date   COLONOSCOPY WITH PROPOFOL  N/A 10/03/2022   Procedure: COLONOSCOPY WITH PROPOFOL ;  Surgeon: Jinny Carmine, MD;  Location: [facility] ENDOSCOPY;  Service: Endoscopy;  Laterality: N/A;   EYE SURGERY     POLYPECTOMY  10/03/2022   Procedure: POLYPECTOMY;  Surgeon: Jinny Carmine, MD;  Location: [facility] ENDOSCOPY;  Service: Endoscopy;;   THYROID  LOBECTOMY  1996   TUBAL LIGATION      Medications:  Current Outpatient Medications on File Prior to Visit  Medication Sig   Calcium Carb-Cholecalciferol (CALCIUM 500 + D PO) Take by mouth daily.   Cholecalciferol (VITAMIN D3)  10 MCG (400 UNIT) tablet Take by mouth.   No current facility-administered medications on file prior to visit.    Allergies:  Allergies  Allergen Reactions   Penicillins Rash   Sulfa Antibiotics Rash    Social History:  Social History   Socioeconomic History   Marital status: Significant Other    Spouse name: Not on file   Number of children: Not on file   Years of education: Not on file   Highest education level: 12th grade  Occupational History   Not on file  Tobacco Use   Smoking status: Never   Smokeless tobacco: Never  Vaping Use   Vaping status: Never Used  Substance and Sexual Activity   Alcohol use: Yes    Alcohol/week: 15.0 standard drinks of alcohol    Types: 5 Glasses of wine, 10 Cans of beer per week   Drug use: Not Currently   Sexual activity: Yes  Other Topics Concern   Not on file  Social History Narrative   Not on file   Social Drivers of Health   Financial Resource Strain: Low Risk   (09/13/2023)   Overall Financial Resource Strain (CARDIA)    Difficulty of Paying Living Expenses: Not hard at all  Food Insecurity: No Food Insecurity (09/13/2023)   Hunger Vital Sign    Worried About Running Out of Food in the Last Year: Never true    Ran Out of Food in the Last Year: Never true  Transportation Needs: No Transportation Needs (09/13/2023)   PRAPARE - Administrator, Civil Service (Medical): No    Lack of Transportation (Non-Medical): No  Physical Activity: Sufficiently Active (09/13/2023)   Exercise Vital Sign    Days of Exercise per Week: 4 days    Minutes of Exercise per Session: 70 min  Stress: No Stress Concern Present (09/13/2023)   [employer] of Occupational [employer] - Occupational Stress Questionnaire    Feeling of Stress: Only a little  Social Connections: Moderately Isolated (09/13/2023)   Social Connection and Isolation Panel    Frequency of Communication with Friends and Family: More than three times a week    Frequency of Social Gatherings with Friends and Family: More than three times a week    Attends Religious Services: Never    Database administrator or Organizations: No    Attends Engineer, structural: Not on file    Marital Status: Married  Catering manager Violence: Not on file   Social History   Tobacco Use  Smoking Status Never  Smokeless Tobacco Never   Social History   Substance and Sexual Activity  Alcohol Use Yes   Alcohol/week: 15.0 standard drinks of alcohol   Types: 5 Glasses of wine, 10 Cans of beer per week    Family History:  History reviewed. No pertinent family history.  Past medical history, surgical history, medications, allergies, family history and social history reviewed with patient today and changes made to appropriate areas of the chart.   Review of Systems  Eyes:  Negative for blurred vision and double vision.  Respiratory:  Negative for shortness of breath.   Cardiovascular:  Negative for  chest pain, palpitations and leg swelling.  Neurological:  Negative for dizziness and headaches.  Psychiatric/Behavioral:  Positive for depression. Negative for suicidal ideas. The patient is not nervous/anxious.    All other ROS negative except what is listed above and in the HPI.      Objective:    BP  132/81   Pulse 77   Ht 5' 7.5 (1.715 m)   Wt 143 lb (64.9 kg)   SpO2 95%   BMI 22.07 kg/m   Wt Readings from Last 3 Encounters:  09/17/23 143 lb (64.9 kg)  03/13/23 147 lb (66.7 kg)  10/03/22 142 lb 4.6 oz (64.5 kg)    Physical Exam Vitals and nursing note reviewed.  Constitutional:      General: She is awake. She is not in acute distress.    Appearance: Normal appearance. She is well-developed. She is not ill-appearing.  HENT:     Head: Normocephalic and atraumatic.     Right Ear: Hearing, tympanic membrane, ear canal and external ear normal. No drainage.     Left Ear: Hearing, tympanic membrane, ear canal and external ear normal. No drainage.     Nose: Nose normal.     Right Sinus: No maxillary sinus tenderness or frontal sinus tenderness.     Left Sinus: No maxillary sinus tenderness or frontal sinus tenderness.     Mouth/Throat:     Mouth: Mucous membranes are moist.     Pharynx: Oropharynx is clear. Uvula midline. No pharyngeal swelling, oropharyngeal exudate or posterior oropharyngeal erythema.   Eyes:     General: Lids are normal.        Right eye: No discharge.        Left eye: No discharge.     Extraocular Movements: Extraocular movements intact.     Conjunctiva/sclera: Conjunctivae normal.     Pupils: Pupils are equal, round, and reactive to light.     Visual Fields: Right eye visual fields normal and left eye visual fields normal.   Neck:     Thyroid : No thyromegaly.     Vascular: No carotid bruit.     Trachea: Trachea normal.   Cardiovascular:     Rate and Rhythm: Normal rate and regular rhythm.     Heart sounds: Normal heart sounds. No murmur heard.     No gallop.  Pulmonary:     Effort: Pulmonary effort is normal. No accessory muscle usage or respiratory distress.     Breath sounds: Normal breath sounds.  Chest:  Breasts:    Right: Normal.     Left: Normal.  Abdominal:     General: Bowel sounds are normal.     Palpations: Abdomen is soft. There is no hepatomegaly or splenomegaly.     Tenderness: There is no abdominal tenderness.   Musculoskeletal:        General: Normal range of motion.     Cervical back: Normal range of motion and neck supple.     Right lower leg: No edema.     Left lower leg: No edema.  Lymphadenopathy:     Head:     Right side of head: No submental, submandibular, tonsillar, preauricular or posterior auricular adenopathy.     Left side of head: No submental, submandibular, tonsillar, preauricular or posterior auricular adenopathy.     Cervical: No cervical adenopathy.     Upper Body:     Right upper body: No supraclavicular, axillary or pectoral adenopathy.     Left upper body: No supraclavicular, axillary or pectoral adenopathy.   Skin:    General: Skin is warm and dry.     Capillary Refill: Capillary refill takes less than 2 seconds.     Findings: No rash.   Neurological:     Mental Status: She is alert and oriented to person, place, and time.  Gait: Gait is intact.   Psychiatric:        Attention and Perception: Attention normal.        Mood and Affect: Mood normal.        Speech: Speech normal.        Behavior: Behavior normal. Behavior is cooperative.        Thought Content: Thought content normal.        Judgment: Judgment normal.     Results for orders placed or performed in visit on 03/13/23  Comp Met (CMET)   Collection Time: 03/13/23  9:41 AM  Result Value Ref Range   Glucose 88 70 - 99 mg/dL   BUN 16 8 - 27 mg/dL   Creatinine, Ser 9.12 0.57 - 1.00 mg/dL   eGFR 74 >40 fO/fpw/8.26   BUN/Creatinine Ratio 18 12 - 28   Sodium 139 134 - 144 mmol/L   Potassium 4.6 3.5 - 5.2 mmol/L    Chloride 102 96 - 106 mmol/L   CO2 25 20 - 29 mmol/L   Calcium 9.3 8.7 - 10.3 mg/dL   Total Protein 7.0 6.0 - 8.5 g/dL   Albumin 4.6 3.9 - 4.9 g/dL   Globulin, Total 2.4 1.5 - 4.5 g/dL   Bilirubin Total 0.6 0.0 - 1.2 mg/dL   Alkaline Phosphatase 66 44 - 121 IU/L   AST 19 0 - 40 IU/L   ALT 15 0 - 32 IU/L  Lipid Profile   Collection Time: 03/13/23  9:41 AM  Result Value Ref Range   Cholesterol, Total 237 (H) 100 - 199 mg/dL   Triglycerides 60 0 - 149 mg/dL   HDL 95 >60 mg/dL   VLDL Cholesterol Cal 10 5 - 40 mg/dL   LDL Chol Calc (NIH) 867 (H) 0 - 99 mg/dL   Chol/HDL Ratio 2.5 0.0 - 4.4 ratio      Assessment & Plan:   Problem List Items Addressed This Visit       Other   Anxiety   Chronic.  Controlled.  Continue with current medication regimen of Celexa  10mg .  Refills sent today.  Labs ordered today.  Return to clinic in 6 months for reevaluation.  Call sooner if concerns arise.       Relevant Medications   citalopram  (CELEXA ) 10 MG tablet   Depression, recurrent (HCC)   Chronic.  Controlled.  Continue with current medication regimen of Celexa  10mg .  Refills sent today.  Labs ordered today.  Return to clinic in 6 months for reevaluation.  Call sooner if concerns arise.       Relevant Medications   citalopram  (CELEXA ) 10 MG tablet   Hypercholesteremia   Chronic.  Controlled.  Continue with current medication regimen.  Labs ordered today.  Return to clinic in 6 months for reevaluation.  Call sooner if concerns arise.       Relevant Orders   Lipid panel   Advanced care planning/counseling discussion   A voluntary discussion about advance care planning including the explanation and discussion of advance directives was extensively discussed  with the patient for 5 minutes with patient.   Explanation about the health care proxy and Living will was reviewed and packet with forms with explanation of how to fill them out was given.  Patient would designate her husband, Larnell.   During this discussion, the patient plans to fill out the paperwork required.  Patient was offered a separate Advance Care Planning visit for further assistance with forms.  Other Visit Diagnoses       Encounter for Medicare annual wellness exam    -  Primary     Annual physical exam       Health maintenance reviewed during visit today.  Labs ordreed.  Vaccines reviewed. Mammogram ordered. Colonoscopy up to date.   Relevant Orders   CBC with Differential/Platelet   Comprehensive metabolic panel with GFR   Lipid panel   TSH     Encounter for screening mammogram for malignant neoplasm of breast       Relevant Orders   MM 3D SCREENING MAMMOGRAM BILATERAL BREAST        Follow up plan: Return in 1 year (on 09/16/2024).   LABORATORY TESTING:  - Pap smear: not applicable  IMMUNIZATIONS:   - Tdap: Tetanus vaccination status reviewed: last tetanus booster within 10 years. - Influenza: Postponed to flu season - Pneumovax: Refused - Prevnar: Refused - COVID: Refused - HPV: Not applicable - Shingrix vaccine: Refused  SCREENING: -Mammogram: Ordered today  - Colonoscopy: Up to date  - Bone Density: Up to date  -Hearing Test: Not applicable  -Spirometry: Not applicable   PATIENT COUNSELING:   Advised to take 1 mg of folate supplement per day if capable of pregnancy.   Sexuality: Discussed sexually transmitted diseases, partner selection, use of condoms, avoidance of unintended pregnancy  and contraceptive alternatives.   Advised to avoid cigarette smoking.  I discussed with the patient that most people either abstain from alcohol or drink within safe limits (<=14/week and <=4 drinks/occasion for males, <=7/weeks and <= 3 drinks/occasion for females) and that the risk for alcohol disorders and other health effects rises proportionally with the number of drinks per week and how often a drinker exceeds daily limits.  Discussed cessation/primary prevention of drug use and  availability of treatment for abuse.   Diet: Encouraged to adjust caloric intake to maintain  or achieve ideal body weight, to reduce intake of dietary saturated fat and total fat, to limit sodium intake by avoiding high sodium foods and not adding table salt, and to maintain adequate dietary potassium and calcium preferably from fresh fruits, vegetables, and low-fat dairy products.    stressed the importance of regular exercise  Injury prevention: Discussed safety belts, safety helmets, smoke detector, smoking near bedding or upholstery.   Dental health: Discussed importance of regular tooth brushing, flossing, and dental visits.    NEXT PREVENTATIVE PHYSICAL DUE IN 1 YEAR. Return in 1 year (on 09/16/2024).

## 2023-09-18 ENCOUNTER — Ambulatory Visit: Payer: Self-pay | Admitting: Nurse Practitioner

## 2023-09-18 LAB — CBC WITH DIFFERENTIAL/PLATELET
Basophils Absolute: 0.1 10*3/uL (ref 0.0–0.2)
Basos: 1 %
EOS (ABSOLUTE): 0.2 10*3/uL (ref 0.0–0.4)
Eos: 4 %
Hematocrit: 39.2 % (ref 34.0–46.6)
Hemoglobin: 12.9 g/dL (ref 11.1–15.9)
Immature Grans (Abs): 0 10*3/uL (ref 0.0–0.1)
Immature Granulocytes: 0 %
Lymphocytes Absolute: 1.6 10*3/uL (ref 0.7–3.1)
Lymphs: 25 %
MCH: 31.6 pg (ref 26.6–33.0)
MCHC: 32.9 g/dL (ref 31.5–35.7)
MCV: 96 fL (ref 79–97)
Monocytes Absolute: 0.7 10*3/uL (ref 0.1–0.9)
Monocytes: 11 %
Neutrophils Absolute: 3.8 10*3/uL (ref 1.4–7.0)
Neutrophils: 59 %
Platelets: 280 10*3/uL (ref 150–450)
RBC: 4.08 x10E6/uL (ref 3.77–5.28)
RDW: 12 % (ref 11.7–15.4)
WBC: 6.3 10*3/uL (ref 3.4–10.8)

## 2023-09-18 LAB — COMPREHENSIVE METABOLIC PANEL WITH GFR
ALT: 18 IU/L (ref 0–32)
AST: 19 IU/L (ref 0–40)
Albumin: 4.6 g/dL (ref 3.9–4.9)
Alkaline Phosphatase: 63 IU/L (ref 44–121)
BUN/Creatinine Ratio: 13 (ref 12–28)
BUN: 11 mg/dL (ref 8–27)
Bilirubin Total: 0.6 mg/dL (ref 0.0–1.2)
CO2: 25 mmol/L (ref 20–29)
Calcium: 9.7 mg/dL (ref 8.7–10.3)
Chloride: 100 mmol/L (ref 96–106)
Creatinine, Ser: 0.86 mg/dL (ref 0.57–1.00)
Globulin, Total: 2.2 g/dL (ref 1.5–4.5)
Glucose: 93 mg/dL (ref 70–99)
Potassium: 4.9 mmol/L (ref 3.5–5.2)
Sodium: 139 mmol/L (ref 134–144)
Total Protein: 6.8 g/dL (ref 6.0–8.5)
eGFR: 74 mL/min/{1.73_m2} (ref >59)

## 2023-09-18 LAB — LIPID PANEL
Chol/HDL Ratio: 2.5 ratio (ref 0.0–4.4)
Cholesterol, Total: 228 mg/dL — ABNORMAL HIGH (ref 100–199)
HDL: 90 mg/dL (ref >39)
LDL Chol Calc (NIH): 125 mg/dL — ABNORMAL HIGH (ref 0–99)
Triglycerides: 77 mg/dL (ref 0–149)
VLDL Cholesterol Cal: 13 mg/dL (ref 5–40)

## 2023-09-18 LAB — TSH: TSH: 0.933 u[IU]/mL (ref 0.450–4.500)

## 2023-10-02 DIAGNOSIS — M542 Cervicalgia: Secondary | ICD-10-CM | POA: Diagnosis not present

## 2023-10-02 DIAGNOSIS — M9903 Segmental and somatic dysfunction of lumbar region: Secondary | ICD-10-CM | POA: Diagnosis not present

## 2023-10-02 DIAGNOSIS — M5416 Radiculopathy, lumbar region: Secondary | ICD-10-CM | POA: Diagnosis not present

## 2023-10-02 DIAGNOSIS — M9901 Segmental and somatic dysfunction of cervical region: Secondary | ICD-10-CM | POA: Diagnosis not present

## 2023-10-23 ENCOUNTER — Ambulatory Visit
Admission: RE | Admit: 2023-10-23 | Discharge: 2023-10-23 | Disposition: A | Discharge status: Home / Self Care | Source: Ambulatory Visit | Attending: Nurse Practitioner | Admitting: Nurse Practitioner

## 2023-10-23 DIAGNOSIS — Z1231 Encounter for screening mammogram for malignant neoplasm of breast: Secondary | ICD-10-CM | POA: Diagnosis not present

## 2023-12-04 DIAGNOSIS — M542 Cervicalgia: Secondary | ICD-10-CM | POA: Diagnosis not present

## 2023-12-04 DIAGNOSIS — M9901 Segmental and somatic dysfunction of cervical region: Secondary | ICD-10-CM | POA: Diagnosis not present

## 2023-12-04 DIAGNOSIS — M5416 Radiculopathy, lumbar region: Secondary | ICD-10-CM | POA: Diagnosis not present

## 2023-12-04 DIAGNOSIS — M9903 Segmental and somatic dysfunction of lumbar region: Secondary | ICD-10-CM | POA: Diagnosis not present

## 2024-01-15 DIAGNOSIS — K080 Exfoliation of teeth due to systemic causes: Secondary | ICD-10-CM | POA: Diagnosis not present

## 2024-01-29 DIAGNOSIS — M9903 Segmental and somatic dysfunction of lumbar region: Secondary | ICD-10-CM | POA: Diagnosis not present

## 2024-01-29 DIAGNOSIS — M542 Cervicalgia: Secondary | ICD-10-CM | POA: Diagnosis not present

## 2024-01-29 DIAGNOSIS — M5416 Radiculopathy, lumbar region: Secondary | ICD-10-CM | POA: Diagnosis not present

## 2024-01-29 DIAGNOSIS — M9901 Segmental and somatic dysfunction of cervical region: Secondary | ICD-10-CM | POA: Diagnosis not present

## 2024-02-06 DIAGNOSIS — K080 Exfoliation of teeth due to systemic causes: Secondary | ICD-10-CM | POA: Diagnosis not present

## 2024-02-25 DIAGNOSIS — K080 Exfoliation of teeth due to systemic causes: Secondary | ICD-10-CM | POA: Diagnosis not present

## 2024-02-26 DIAGNOSIS — K080 Exfoliation of teeth due to systemic causes: Secondary | ICD-10-CM | POA: Diagnosis not present

## 2024-03-16 DIAGNOSIS — K080 Exfoliation of teeth due to systemic causes: Secondary | ICD-10-CM | POA: Diagnosis not present

## 2024-04-21 ENCOUNTER — Other Ambulatory Visit: Payer: Self-pay | Admitting: Nurse Practitioner

## 2024-04-21 NOTE — Telephone Encounter (Signed)
 Requested Prescriptions  Pending Prescriptions Disp Refills   montelukast  (SINGULAIR ) 10 MG tablet [Pharmacy Med Name: MONTELUKAST  SOD 10 MG TABLET] 90 tablet 1    Sig: TAKE 1 TABLET BY MOUTH AT BEDTIME     Pulmonology:  Leukotriene Inhibitors Passed - 04/21/2024  5:54 PM      Passed - Valid encounter within last 12 months    Recent Outpatient Visits           7 months ago Encounter for [employer] annual wellness exam   [place] Melvin Pao, NP

## 2024-09-17 ENCOUNTER — Encounter: Admitting: Nurse Practitioner
